# Patient Record
Sex: Male | Born: 1975 | Race: White | Hispanic: No | Marital: Married | Smoking: Former smoker
Health system: Southern US, Community
[De-identification: ages and names within clinical notes are randomized; demographics above are authoritative.]

## PROBLEM LIST (undated history)

## (undated) DIAGNOSIS — R7303 Prediabetes: Secondary | ICD-10-CM

## (undated) DIAGNOSIS — A749 Chlamydial infection, unspecified: Secondary | ICD-10-CM

## (undated) DIAGNOSIS — I10 Essential (primary) hypertension: Secondary | ICD-10-CM

## (undated) HISTORY — DX: Prediabetes: R73.03

## (undated) HISTORY — DX: Chlamydial infection, unspecified: A74.9

## (undated) HISTORY — DX: Essential (primary) hypertension: I10

## (undated) HISTORY — PX: HERNIA REPAIR: SHX51

---

## 1997-10-31 ENCOUNTER — Emergency Department (HOSPITAL_COMMUNITY): Admission: EM | Admit: 1997-10-31 | Discharge: 1997-10-31 | Payer: Self-pay | Admitting: Emergency Medicine

## 2005-07-01 ENCOUNTER — Emergency Department (HOSPITAL_COMMUNITY): Admission: EM | Admit: 2005-07-01 | Discharge: 2005-07-01 | Payer: Self-pay | Admitting: Emergency Medicine

## 2007-10-06 ENCOUNTER — Emergency Department (HOSPITAL_COMMUNITY): Admission: EM | Admit: 2007-10-06 | Discharge: 2007-10-06 | Payer: Self-pay | Admitting: Emergency Medicine

## 2010-06-28 NOTE — Consult Note (Signed)
NAME:  Ralph Small, Ralph Small            ACCOUNT NO.:  [id number]   MEDICAL RECORD NO.:  [id number]          PATIENT TYPE:  EMS   LOCATION:  [facility]                         FACILITY:  [facility]   PHYSICIAN:  Karol T. Lazarus Salines, M.D. DATE OF BIRTH:  [birth year]   DATE OF CONSULTATION:  10/06/2007  DATE OF DISCHARGE:                                 CONSULTATION   CHIEF COMPLAINT:  Severe sore throat.   HISTORY:  A 35 year old white male began having a significant sore  throat 6 days ago.  It lateralized to the right side including pain with  swallowing, pain with jaw motion, pain up into the ear, and some  tenderness in his neck.  He was seen in the emergency room earlier today  and found to have a positive strep culture, a positive monospot, and by  CT scan, also a right peritonsillar abscess.  He has not had previous  trouble with mono, strep throat, tonsillitis, nor has he had a prior  history of abscess.  He is not diabetic or otherwise immune compromised.   PAST MEDICAL HISTORY:  No known allergies.  No current medications.   SOCIAL HISTORY:  He is single.  He works for the organ transplant team.   FAMILY HISTORY:  Noncontributory.   REVIEW OF SYSTEMS:  Noncontributory.   PHYSICAL EXAMINATION:  This is a large framed and obese adult white  male, who appears distressed.  He has some fetor oris.  He does not have  a frank hot potato voice.  Mental status is sharp.  He hears well in  conversational speech.  Respirations unlabored through the nose.  The  head is atraumatic and neck is supple.  Cranial nerves intact.  I did  not examine the ear canals nor the anterior nose.  Oral cavity is fleshy  with teeth in good repair.  He has exudative tonsillitis on both sides  with bulging of the right hemi soft palate.  I did not examine  nasopharynx or hypopharynx.  Neck is without significant adenopathy.   IMPRESSION:  Right peritonsillar abscess.  Streptococcal tonsillitis.  Possible acute  mononucleosis.   PLAN:  I discussed this with the patient.  I will give him a dose of  intravenous penicillin G, intravenous Decadron, and I recommend that we  do an incision and drainage of the peritonsillar abscess here in the  emergency room today.  Details were discussed.  Questions were answered  and informed consent was obtained.   The patient received 4 mg of IV morphine and 2 mg of IV Versed for  conscious sedation technique.  While this was taking effect, the soft  palate was superficially anesthetized with Hurricaine spray x2.  Following this, 12 mL total of 1% Xylocaine with 1:100,000 epinephrine  was infiltrated superficially and then more deeply and into the right  peritonsillar plane with 1% Xylocaine with 1:100,000 epinephrine.  Several minutes were allowed for this to take effect.  At one of the  needle puncture sites, a small amount of pus was noted to drain back  out.   Under an adequate level, a 2-cm crescent incision  was made over the  superior pole of the right tonsil and carried down on the capsule of  tonsil approximately 1 cm.  No abscess cavity was encountered.  Deep to  this, the peritonsillar plane was opened with a tonsil hemostat.  A  purulent pocket was identified and drained approximately 8 mL of frank  pus with a fairly foul odor.  The patient tolerated this nicely.  Hemostasis was spontaneous.   He was allowed to gargle with saline and peroxide to clean up the mouth.  I will give him prescriptions for penicillin VK, Tylenol with Codeine  liquid.  I discussed the advancement of diet and activity.  I will see  him back in 1 week, sooner as needed.  He understands and agrees with  the discussion and plans.      Gloris [place]. Lazarus Salines, M.D.  Electronically Signed     KTW/MEDQ  D:  10/06/2007  T:  10/07/2007  Job:  [id number]

## 2017-11-21 ENCOUNTER — Encounter: Payer: Self-pay | Admitting: Cardiology

## 2017-11-21 NOTE — Telephone Encounter (Signed)
This encounter was created in error - please disregard.

## 2019-06-16 ENCOUNTER — Ambulatory Visit (INDEPENDENT_AMBULATORY_CARE_PROVIDER_SITE_OTHER): Payer: Managed Care, Other (non HMO) | Admitting: Family Medicine

## 2019-06-16 ENCOUNTER — Encounter: Payer: Self-pay | Admitting: Family Medicine

## 2019-06-16 ENCOUNTER — Other Ambulatory Visit: Payer: Self-pay

## 2019-06-16 VITALS — BP 138/76 | HR 79 | Temp 98.3°F | Ht 73.5 in | Wt >= 6400 oz

## 2019-06-16 DIAGNOSIS — R350 Frequency of micturition: Secondary | ICD-10-CM | POA: Diagnosis not present

## 2019-06-16 DIAGNOSIS — Z6841 Body Mass Index (BMI) 40.0 and over, adult: Secondary | ICD-10-CM

## 2019-06-16 DIAGNOSIS — Z13220 Encounter for screening for lipoid disorders: Secondary | ICD-10-CM

## 2019-06-16 DIAGNOSIS — G479 Sleep disorder, unspecified: Secondary | ICD-10-CM

## 2019-06-16 DIAGNOSIS — Z0000 Encounter for general adult medical examination without abnormal findings: Secondary | ICD-10-CM | POA: Insufficient documentation

## 2019-06-16 DIAGNOSIS — M79672 Pain in left foot: Secondary | ICD-10-CM

## 2019-06-16 DIAGNOSIS — R400 Somnolence: Secondary | ICD-10-CM

## 2019-06-16 DIAGNOSIS — M79671 Pain in right foot: Secondary | ICD-10-CM

## 2019-06-16 NOTE — Patient Instructions (Signed)
Great to meet you today! Please have labs completed, we'll be in touch with results.  Expect phone calls to arrange for sleep study and sports medicine appt. To discuss orthotics.     Exercising to Lose Weight Exercise is structured, repetitive physical activity to improve fitness and health. Getting regular exercise is important for everyone. It is especially important if you are overweight. Being overweight increases your risk of heart disease, stroke, diabetes, high blood pressure, and several types of cancer. Reducing your calorie intake and exercising can help you lose weight. Exercise is usually categorized as moderate or vigorous intensity. To lose weight, most people need to do a certain amount of moderate-intensity or vigorous-intensity exercise each week. Moderate-intensity exercise  Moderate-intensity exercise is any activity that gets you moving enough to burn at least three times more energy (calories) than if you were sitting. Examples of moderate exercise include:  Walking a mile in 15 minutes.  Doing light yard work.  Biking at an easy pace. Most people should get at least 150 minutes (2 hours and 30 minutes) a week of moderate-intensity exercise to maintain their body weight. Vigorous-intensity exercise Vigorous-intensity exercise is any activity that gets you moving enough to burn at least six times more calories than if you were sitting. When you exercise at this intensity, you should be working hard enough that you are not able to carry on a conversation. Examples of vigorous exercise include:  Running.  Playing a team sport, such as football, basketball, and soccer.  Jumping rope. Most people should get at least 75 minutes (1 hour and 15 minutes) a week of vigorous-intensity exercise to maintain their body weight. How can exercise affect me? When you exercise enough to burn more calories than you eat, you lose weight. Exercise also reduces body fat and builds  muscle. The more muscle you have, the more calories you burn. Exercise also:  Improves mood.  Reduces stress and tension.  Improves your overall fitness, flexibility, and endurance.  Increases bone strength. The amount of exercise you need to lose weight depends on:  Your age.  The type of exercise.  Any health conditions you have.  Your overall physical ability. Talk to your health care provider about how much exercise you need and what types of activities are safe for you. What actions can I take to lose weight? Nutrition   Make changes to your diet as told by your health care provider or diet and nutrition specialist (dietitian). This may include: ? Eating fewer calories. ? Eating more protein. ? Eating less unhealthy fats. ? Eating a diet that includes fresh fruits and vegetables, whole grains, low-fat dairy products, and lean protein. ? Avoiding foods with added fat, salt, and sugar.  Drink plenty of water while you exercise to prevent dehydration or heat stroke. Activity  Choose an activity that you enjoy and set realistic goals. Your health care provider can help you make an exercise plan that works for you.  Exercise at a moderate or vigorous intensity most days of the week. ? The intensity of exercise may vary from person to person. You can tell how intense a workout is for you by paying attention to your breathing and heartbeat. Most people will notice their breathing and heartbeat get faster with more intense exercise.  Do resistance training twice each week, such as: ? Push-ups. ? Sit-ups. ? Lifting weights. ? Using resistance bands.  Getting short amounts of exercise can be just as helpful as long structured periods  of exercise. If you have trouble finding time to exercise, try to include exercise in your daily routine. ? Get up, stretch, and walk around every 30 minutes throughout the day. ? Go for a walk during your lunch break. ? Park your car farther  away from your destination. ? If you take public transportation, get off one stop early and walk the rest of the way. ? Make phone calls while standing up and walking around. ? Take the stairs instead of elevators or escalators.  Wear comfortable clothes and shoes with good support.  Do not exercise so much that you hurt yourself, feel dizzy, or get very short of breath. Where to find more information  U.S. Department of Health and Human Services: ThisPath.fi  Centers for Disease Control and Prevention (CDC): FootballExhibition.com.br Contact a health care provider:  Before starting a new exercise program.  If you have questions or concerns about your weight.  If you have a medical problem that keeps you from exercising. Get help right away if you have any of the following while exercising:  Injury.  Dizziness.  Difficulty breathing or shortness of breath that does not go away when you stop exercising.  Chest pain.  Rapid heartbeat. Summary  Being overweight increases your risk of heart disease, stroke, diabetes, high blood pressure, and several types of cancer.  Losing weight happens when you burn more calories than you eat.  Reducing the amount of calories you eat in addition to getting regular moderate or vigorous exercise each week helps you lose weight. This information is not intended to replace advice given to you by your health care provider. Make sure you discuss any questions you have with your health care provider. Document Revised: 02/12/2017 Document Reviewed: 02/12/2017 Elsevier Patient Education  2020 [employer].

## 2019-06-16 NOTE — Progress Notes (Signed)
Ralph Small - 44 y.o. male MRN [id number]  Date of birth: 1975/05/16  Subjective Chief Complaint  Patient presents with  . Weight Loss  . Diabetes    HPI Ralph Small is a 44 y.o. male here today for initial visit and annual exam. He reports weight gain of about 80lbs over the past year.  Attributes this to decreased activity and increased snacking while at home due to pandemic.  He is concerned about developing health problems due to this. Reports increased fatigue, falls asleep easily in the evening after dinner and is tired throughout the day.  He also admits to increased urination, especially at night.  He does wake up gasping for air and heart racing some nights.  He has never had sleep study.   He has started working on dietary changes and walking more.  He does tire easily and have pain due to over supination.  He was going to have orthotics made however could not afford previously.   He is trying to increase stamina.   He is a non-smoker and rarely consumes EtOH.   Review of Systems  Constitutional: Negative for chills, fever, malaise/fatigue and weight loss.  HENT: Negative for congestion, ear pain and sore throat.   Eyes: Negative for blurred vision, double vision and pain.  Respiratory: Negative for cough and shortness of breath.   Cardiovascular: Negative for chest pain and palpitations.  Gastrointestinal: Negative for abdominal pain, blood in stool, constipation, heartburn and nausea.  Genitourinary: Positive for frequency. Negative for dysuria and urgency.  Musculoskeletal: Negative for joint pain and myalgias.  Neurological: Negative for dizziness and headaches.  Endo/Heme/Allergies: Does not bruise/bleed easily.  Psychiatric/Behavioral: Negative for depression. The patient is not nervous/anxious and does not have insomnia.     No Known Allergies  Past Medical History:  Diagnosis Date  . Chlamydia     Past Surgical History:  Procedure Laterality Date   . HERNIA REPAIR      Social History   Socioeconomic History  . Marital status: Married    Spouse name: Not on file  . Number of children: Not on file  . Years of education: Not on file  . Highest education level: Not on file  Occupational History  . Occupation: Coordinator    Comment: [employer]  Tobacco Use  . Smoking status: Former Smoker    Packs/day: 0.50    Years: 15.00    Pack years: 7.50    Types: Cigarettes    Quit date: 10/13/2009    Years since quitting: 9.6  . Smokeless tobacco: Never Used  Substance and Sexual Activity  . Alcohol use: Yes    Comment: Rarely  . Drug use: Never  . Sexual activity: Not Currently    Partners: Female    Birth control/protection: Abstinence  Other Topics Concern  . Not on file  Social History Narrative  . Not on file   Social Determinants of Health   Financial Resource Strain:   . Difficulty of Paying Living Expenses:   Food Insecurity:   . Worried About Programme researcher, broadcasting/film/video in the Last Year:   . Barista in the Last Year:   Transportation Needs:   . Freight forwarder (Medical):   Marland Kitchen Lack of Transportation (Non-Medical):   Physical Activity:   . Days of Exercise per Week:   . Minutes of Exercise per Session:   Stress:   . Feeling of Stress :   Social Connections:   .  Frequency of Communication with Friends and Family:   . Frequency of Social Gatherings with Friends and Family:   . Attends Religious Services:   . Active Member of Clubs or Organizations:   . Attends Archivist Meetings:   Marland Kitchen Marital Status:     No family history on file.  Health Maintenance  Topic Date Due  . HIV Screening  Never done  . TETANUS/TDAP  Never done  . INFLUENZA VACCINE  09/14/2019     ----------------------------------------------------------------------------------------------------------------------------------------------------------------------------------------------------------------- Physical Exam BP  138/76 (BP Location: Left Wrist, Patient Position: Sitting, Cuff Size: Large)   Pulse 79   Temp 98.3 F (36.8 C) (Oral)   Ht 6' 1.5" (1.867 m)   Wt (!) 514 lb 4.8 oz (233.3 kg)   SpO2 97%   BMI 66.93 kg/m   Physical Exam Constitutional:      General: He is not in acute distress. HENT:     Head: Normocephalic and atraumatic.     Right Ear: External ear normal.     Left Ear: External ear normal.     Mouth/Throat:     Mouth: Mucous membranes are moist.  Eyes:     General: No scleral icterus. Neck:     Thyroid: No thyromegaly.  Cardiovascular:     Rate and Rhythm: Normal rate and regular rhythm.     Heart sounds: Normal heart sounds.  Pulmonary:     Effort: Pulmonary effort is normal.     Breath sounds: Normal breath sounds.  Abdominal:     General: Bowel sounds are normal. There is no distension.     Palpations: Abdomen is soft.     Tenderness: There is no abdominal tenderness. There is no guarding.  Musculoskeletal:     Cervical back: Normal range of motion.  Lymphadenopathy:     Cervical: No cervical adenopathy.  Skin:    General: Skin is warm and dry.     Findings: No rash.  Neurological:     General: No focal deficit present.     Mental Status: He is alert and oriented to person, place, and time.     Cranial Nerves: No cranial nerve deficit.     Motor: No abnormal muscle tone.  Psychiatric:        Mood and Affect: Mood normal.        Behavior: Behavior normal.     ------------------------------------------------------------------------------------------------------------------------------------------------------------------------------------------------------------------- Assessment and Plan  Well adult exam Well adult Orders Placed This Encounter  Procedures  . COMPLETE METABOLIC PANEL WITH GFR  . CBC  . Lipid Profile  . TSH  . HgB A1c  . Ambulatory referral to Sleep Studies    Referral Priority:   Routine    Referral Type:   Consultation     Referral Reason:   Specialty Services Required    Number of Visits Requested:   1  . Ambulatory referral to Sports Medicine    Referral Priority:   Routine    Referral Type:   Consultation    Number of Visits Requested:   1  Screening: Lipid panel, A1c for increased urination.   Sleep study ordered as well.  Immunizations: UTD Anticipatory guidance/Risk factor reduction:  Discussed working on weight loss.  Increase walking to build stamina and tolerance.  Referral to sports med to discuss orthotics.     No orders of the defined types were placed in this encounter.   No follow-ups on file.    This visit occurred during the SARS-CoV-2 public health emergency.  Safety protocols were in place, including screening questions prior to the visit, additional usage of staff PPE, and extensive cleaning of exam room while observing appropriate contact time as indicated for disinfecting solutions.

## 2019-06-16 NOTE — Assessment & Plan Note (Signed)
Well adult Orders Placed This Encounter  Procedures  . COMPLETE METABOLIC PANEL WITH GFR  . CBC  . Lipid Profile  . TSH  . HgB A1c  . Ambulatory referral to Sleep Studies    Referral Priority:   Routine    Referral Type:   Consultation    Referral Reason:   Specialty Services Required    Number of Visits Requested:   1  . Ambulatory referral to Sports Medicine    Referral Priority:   Routine    Referral Type:   Consultation    Number of Visits Requested:   1  Screening: Lipid panel, A1c for increased urination.   Sleep study ordered as well.  Immunizations: UTD Anticipatory guidance/Risk factor reduction:  Discussed working on weight loss.  Increase walking to build stamina and tolerance.  Referral to sports med to discuss orthotics.

## 2019-06-17 LAB — CBC
HCT: 44 % (ref 38.5–50.0)
Hemoglobin: 14.7 g/dL (ref 13.2–17.1)
MCH: 30.4 pg (ref 27.0–33.0)
MCHC: 33.4 g/dL (ref 32.0–36.0)
MCV: 91.1 fL (ref 80.0–100.0)
MPV: 10.7 fL (ref 7.5–12.5)
Platelets: 241 10*3/uL (ref 140–400)
RBC: 4.83 10*6/uL (ref 4.20–5.80)
RDW: 11.7 % (ref 11.0–15.0)
WBC: 8.3 10*3/uL (ref 3.8–10.8)

## 2019-06-17 LAB — LIPID PANEL
Cholesterol: 179 mg/dL (ref <200)
HDL: 38 mg/dL — ABNORMAL LOW (ref >=40)
LDL Cholesterol (Calc): 123 mg/dL (calc) — ABNORMAL HIGH
Non-HDL Cholesterol (Calc): 141 mg/dL (calc) — ABNORMAL HIGH (ref <130)
Total CHOL/HDL Ratio: 4.7 (calc) (ref <5.0)
Triglycerides: 82 mg/dL (ref <150)

## 2019-06-17 LAB — COMPLETE METABOLIC PANEL WITH GFR
AG Ratio: 1.3 (calc) (ref 1.0–2.5)
ALT: 42 U/L (ref 9–46)
AST: 23 U/L (ref 10–40)
Albumin: 4.2 g/dL (ref 3.6–5.1)
Alkaline phosphatase (APISO): 59 U/L (ref 36–130)
BUN: 20 mg/dL (ref 7–25)
CO2: 30 mmol/L (ref 20–32)
Calcium: 9.7 mg/dL (ref 8.6–10.3)
Chloride: 103 mmol/L (ref 98–110)
Creat: 0.92 mg/dL (ref 0.60–1.35)
GFR, Est African American: 117 mL/min/{1.73_m2} (ref >=60)
GFR, Est Non African American: 101 mL/min/{1.73_m2} (ref >=60)
Globulin: 3.2 g/dL (calc) (ref 1.9–3.7)
Glucose, Bld: 115 mg/dL — ABNORMAL HIGH (ref 65–99)
Potassium: 4.6 mmol/L (ref 3.5–5.3)
Sodium: 138 mmol/L (ref 135–146)
Total Bilirubin: 0.4 mg/dL (ref 0.2–1.2)
Total Protein: 7.4 g/dL (ref 6.1–8.1)

## 2019-06-17 LAB — HEMOGLOBIN A1C
Hgb A1c MFr Bld: 5.7 % of total Hgb — ABNORMAL HIGH (ref <5.7)
Mean Plasma Glucose: 117 (calc)
eAG (mmol/L): 6.5 (calc)

## 2019-06-17 LAB — TSH: TSH: 2.37 mIU/L (ref 0.40–4.50)

## 2019-06-24 ENCOUNTER — Other Ambulatory Visit: Payer: Self-pay

## 2019-06-24 ENCOUNTER — Ambulatory Visit (HOSPITAL_BASED_OUTPATIENT_CLINIC_OR_DEPARTMENT_OTHER)
Admission: RE | Admit: 2019-06-24 | Discharge: 2019-06-24 | Disposition: A | Discharge status: Home / Self Care | Payer: Managed Care, Other (non HMO) | Source: Ambulatory Visit | Attending: Family Medicine | Admitting: Family Medicine

## 2019-06-24 ENCOUNTER — Ambulatory Visit (INDEPENDENT_AMBULATORY_CARE_PROVIDER_SITE_OTHER): Payer: Managed Care, Other (non HMO) | Admitting: Family Medicine

## 2019-06-24 ENCOUNTER — Encounter: Payer: Self-pay | Admitting: Family Medicine

## 2019-06-24 DIAGNOSIS — M25872 Other specified joint disorders, left ankle and foot: Secondary | ICD-10-CM | POA: Insufficient documentation

## 2019-06-24 NOTE — Progress Notes (Signed)
Ralph Small - 44 y.o. male MRN [id number]  Date of birth: 09/11/1975  SUBJECTIVE:  Including CC & ROS.  Chief Complaint  Patient presents with  . Foot Orthotics    ANASTASIOS Small is a 44 y.o. male that is presenting with acute on chronic left ankle pain.  The pain is been ongoing for some time.  It usually occurs on the lateral aspect of the ankle joint.  He has a history of excessive supination and has tried different methods to improve this area.  Seems to be worse with no prolonged walking that he does.  Has a distant history of inversion injuries.   Review of Systems See HPI   HISTORY: Past Medical, Surgical, Social, and Family History Reviewed & Updated per EMR.   Pertinent Historical Findings include:  Past Medical History:  Diagnosis Date  . Chlamydia     Past Surgical History:  Procedure Laterality Date  . HERNIA REPAIR      Family History  Problem Relation Age of Onset  . Diabetes Mother   . Heart attack Father   . Breast cancer Maternal Aunt     Social History   Socioeconomic History  . Marital status: Married    Spouse name: Not on file  . Number of children: Not on file  . Years of education: Not on file  . Highest education level: Not on file  Occupational History  . Occupation: Coordinator    Comment: [employer]  Tobacco Use  . Smoking status: Former Smoker    Packs/day: 0.50    Years: 15.00    Pack years: 7.50    Types: Cigarettes    Quit date: 10/13/2009    Years since quitting: 9.7  . Smokeless tobacco: Never Used  Substance and Sexual Activity  . Alcohol use: Yes    Comment: Rarely  . Drug use: Never  . Sexual activity: Not Currently    Partners: Female    Birth control/protection: Abstinence  Other Topics Concern  . Not on file  Social History Narrative  . Not on file   Social Determinants of Health   Financial Resource Strain:   . Difficulty of Paying Living Expenses:   Food Insecurity:   . Worried About Patent examiner in the Last Year:   . Barista in the Last Year:   Transportation Needs:   . Freight forwarder (Medical):   Marland Kitchen Lack of Transportation (Non-Medical):   Physical Activity:   . Days of Exercise per Week:   . Minutes of Exercise per Session:   Stress:   . Feeling of Stress :   Social Connections:   . Frequency of Communication with Friends and Family:   . Frequency of Social Gatherings with Friends and Family:   . Attends Religious Services:   . Active Member of Clubs or Organizations:   . Attends Banker Meetings:   Marland Kitchen Marital Status:   Intimate Partner Violence:   . Fear of Current or Ex-Partner:   . Emotionally Abused:   Marland Kitchen Physically Abused:   . Sexually Abused:      PHYSICAL EXAM:  VS: BP (!) 146/76   Pulse 88   Ht 6\' 2"  (1.88 m)   Wt (!) 514 lb (233.1 kg)   BMI 65.99 kg/m  Physical Exam Gen: NAD, alert, cooperative with exam, well-appearing Right and left foot: Excessive supination with the left. Tenderness to palpation over the peroneal tendons. Right remains fairly  neutral. Normal strength resistance. Neurovascularly intact  Patient was fitted for a standard, cushioned, semi-rigid orthotic. The orthotic was heated and afterward the patient stood on the orthotic blank positioned on the orthotic stand. The patient was positioned in subtalar neutral position and 10 degrees of ankle dorsiflexion in a weight bearing stance. After completion of molding, a stable base was applied to the orthotic blank. The blank was ground to a stable position for weight bearing. Size: 14 Pairs: 2 Base: Blue EVA Additional Posting and Padding: Fifth ray post on the left.  Also took a three-quarter heel lift and cut diagonally to create a lateral wedge extending the hindfoot.  Placed a lateral wedge on the right. The patient ambulated these, and they were very comfortable.    ASSESSMENT & PLAN:   Ankle impingement syndrome, left Has excessive  supination of the left with it being worse than the right.  Trying to increase no wounds on the lateral aspect of her knee more than neutral.  Placed a smaller wedge on the right. -Orthotics. -X-ray. -Counseled on home exercise therapy and supportive care. -Could consider physical therapy.  Morbid obesity ([place]) Discussed different possibilities today.  Could consider referral to weight management.

## 2019-06-24 NOTE — Patient Instructions (Signed)
Nice to meet you Please try ice as needed  Please try the exercises  We may need to put the extra padding on the insoles every 3-6 months I will call with the results from today  Please send me a message in MyChart with any questions or updates.  Please see me back in 4-6 weeks.   --Dr. Jordan Likes

## 2019-06-24 NOTE — Assessment & Plan Note (Signed)
Discussed different possibilities today.  Could consider referral to weight management.

## 2019-06-24 NOTE — Assessment & Plan Note (Signed)
Has excessive supination of the left with it being worse than the right.  Trying to increase no wounds on the lateral aspect of her knee more than neutral.  Placed a smaller wedge on the right. -Orthotics. -X-ray. -Counseled on home exercise therapy and supportive care. -Could consider physical therapy.

## 2019-06-25 ENCOUNTER — Telehealth: Payer: Self-pay | Admitting: Family Medicine

## 2019-06-25 NOTE — Telephone Encounter (Signed)
Left VM for patient. If he calls back please have him speak with a nurse/CMA and inform that his ankle joint looks good. He has chronic changes in the lateral hindfoot. We may need to consider a foot xay in the future.   If any questions then please take the best time and phone number to call and I will try to call him back.   Myra Rude, MD [facility] 06/25/2019, 9:12 AM

## 2019-08-06 ENCOUNTER — Other Ambulatory Visit: Payer: Self-pay

## 2019-08-06 ENCOUNTER — Ambulatory Visit: Payer: Managed Care, Other (non HMO) | Admitting: Family Medicine

## 2019-08-06 ENCOUNTER — Encounter: Payer: Self-pay | Admitting: Family Medicine

## 2019-08-06 DIAGNOSIS — M25872 Other specified joint disorders, left ankle and foot: Secondary | ICD-10-CM

## 2019-08-06 NOTE — Assessment & Plan Note (Signed)
Has been doing well with insoles and is getting stability and strength. -Counseled on home exercise therapy and supportive care. -Could consider adjusting the lateral wedge if needed.  Could consider injection of physical therapy.

## 2019-08-06 NOTE — Progress Notes (Signed)
Ralph Small - 44 y.o. male MRN [id number]  Date of birth: Sep 01, 1975  SUBJECTIVE:  Including CC & ROS.  Chief Complaint  Patient presents with  . Follow-up    left ankle    Ralph Small is a 44 y.o. male that is following up for his ankle pain.  He has been active in his exercise classes loss of weight.  He reports improvement of his range of motion as well as stability.  He feels like the insoles have been helping.   Review of Systems See HPI   HISTORY: Past Medical, Surgical, Social, and Family History Reviewed & Updated per EMR.   Pertinent Historical Findings include:  Past Medical History:  Diagnosis Date  . Chlamydia     Past Surgical History:  Procedure Laterality Date  . HERNIA REPAIR      Family History  Problem Relation Age of Onset  . Diabetes Mother   . Heart attack Father   . Breast cancer Maternal Aunt     Social History   Socioeconomic History  . Marital status: Married    Spouse name: Not on file  . Number of children: Not on file  . Years of education: Not on file  . Highest education level: Not on file  Occupational History  . Occupation: Coordinator    Comment: [employer]  Tobacco Use  . Smoking status: Former Smoker    Packs/day: 0.50    Years: 15.00    Pack years: 7.50    Types: Cigarettes    Quit date: 10/13/2009    Years since quitting: 9.8  . Smokeless tobacco: Never Used  Vaping Use  . Vaping Use: Every day  Substance and Sexual Activity  . Alcohol use: Yes    Comment: Rarely  . Drug use: Never  . Sexual activity: Not Currently    Partners: Female    Birth control/protection: Abstinence  Other Topics Concern  . Not on file  Social History Narrative  . Not on file   Social Determinants of Health   Financial Resource Strain:   . Difficulty of Paying Living Expenses:   Food Insecurity:   . Worried About Programme researcher, broadcasting/film/video in the Last Year:   . Barista in the Last Year:   Transportation Needs:   .  Freight forwarder (Medical):   Marland Kitchen Lack of Transportation (Non-Medical):   Physical Activity:   . Days of Exercise per Week:   . Minutes of Exercise per Session:   Stress:   . Feeling of Stress :   Social Connections:   . Frequency of Communication with Friends and Family:   . Frequency of Social Gatherings with Friends and Family:   . Attends Religious Services:   . Active Member of Clubs or Organizations:   . Attends Banker Meetings:   Marland Kitchen Marital Status:   Intimate Partner Violence:   . Fear of Current or Ex-Partner:   . Emotionally Abused:   Marland Kitchen Physically Abused:   . Sexually Abused:      PHYSICAL EXAM:  VS: BP (!) 145/82   Pulse 98   Ht 6\' 2"  (1.88 m)   BMI 65.99 kg/m  Physical Exam Gen: NAD, alert, cooperative with exam, well-appearing MSK:  Left ankle: Able to raise up on tiptoes and low back on heels. Normal strength resistance. Neurovascularly intact     ASSESSMENT & PLAN:   Ankle impingement syndrome, left Has been doing well with insoles and  is getting stability and strength. -Counseled on home exercise therapy and supportive care. -Could consider adjusting the lateral wedge if needed.  Could consider injection of physical therapy.

## 2020-03-18 ENCOUNTER — Emergency Department
Admission: RE | Admit: 2020-03-18 | Discharge: 2020-03-18 | Disposition: A | Discharge status: Home / Self Care | Payer: Managed Care, Other (non HMO) | Source: Ambulatory Visit | Attending: Family Medicine | Admitting: Family Medicine

## 2020-03-18 ENCOUNTER — Other Ambulatory Visit: Payer: Self-pay

## 2020-03-18 VITALS — BP 162/85 | HR 82 | Temp 98.2°F | Resp 16 | Ht 74.0 in | Wt >= 6400 oz

## 2020-03-18 DIAGNOSIS — H6692 Otitis media, unspecified, left ear: Secondary | ICD-10-CM | POA: Diagnosis not present

## 2020-03-18 DIAGNOSIS — H9313 Tinnitus, bilateral: Secondary | ICD-10-CM | POA: Diagnosis not present

## 2020-03-18 HISTORY — DX: Morbid (severe) obesity due to excess calories: E66.01

## 2020-03-18 MED ORDER — AMOXICILLIN 875 MG PO TABS: ORAL_TABLET | ORAL | 0 refills | Status: DC

## 2020-03-18 MED ORDER — PREDNISONE 20 MG PO TABS: ORAL_TABLET | ORAL | 0 refills | Status: DC

## 2020-03-18 NOTE — ED Triage Notes (Signed)
Patient here day 3 of having vertigo and tinnitus bilaterally; pressure behind eyes; has had postural vertigo in past. Patient has not had covid nor influenza vaccinations.

## 2020-03-18 NOTE — ED Provider Notes (Signed)
[facility]    CSN: [id number] Arrival date & time: 03/18/20  1456      History   Chief Complaint Chief Complaint  Patient presents with  . Ear Problem    HPI Ralph Small is a 45 y.o. male.   Patient complains of onset three days ago of intermittent tinnitus (mostly when supine) and intermittent vertigo.  He denies recent URI, fever, hearing loss, earache, other neurologic symptoms, and headache.  He states that he has had similar vertigo in the past that resolved spontaneously.  The history is provided by the patient.    Past Medical History:  Diagnosis Date  . Chlamydia   . Morbid obesity [facility])     Patient Active Problem List   Diagnosis Date Noted  . Morbid obesity (HCC) 06/24/2019  . Ankle impingement syndrome, left 06/24/2019  . Well adult exam 06/16/2019    Past Surgical History:  Procedure Laterality Date  . HERNIA REPAIR         Home Medications    Prior to Admission medications   Medication Sig Start Date End Date Taking? Authorizing Provider  amoxicillin (AMOXIL) 875 MG tablet Take one tab PO Q12hr 03/18/20  Yes Chaia Ikard, Tera Mater, MD  predniSONE (DELTASONE) 20 MG tablet Take one tab by mouth twice daily for 4 days, then one daily. Take with food. 03/18/20  Yes Lattie Haw, MD    Family History Family History  Problem Relation Age of Onset  . Diabetes Mother   . Heart attack Father   . Breast cancer Maternal Aunt     Social History Social History   Tobacco Use  . Smoking status: Former Smoker    Packs/day: 0.50    Years: 15.00    Pack years: 7.50    Types: Cigarettes    Quit date: 10/13/2009    Years since quitting: 10.4  . Smokeless tobacco: Never Used  Vaping Use  . Vaping Use: Every day  Substance Use Topics  . Alcohol use: Yes    Comment: Rarely  . Drug use: Never     Allergies   Patient has no known allergies.   Review of Systems Review of Systems No sore throat No cough No pleuritic pain No  wheezing + nasal congestion No post-nasal drainage No sinus pain/pressure No itchy/red eyes No earache No hemoptysis + tinnitus + dizziness, intermittent No hearing loss No SOB No fever/chills No nausea No vomiting No abdominal pain No diarrhea No urinary symptoms No skin rash No fatigue No myalgias No headache   Physical Exam Triage Vital Signs ED Triage Vitals  Enc Vitals Group     BP 03/18/20 1522 (!) 162/85     Pulse Rate 03/18/20 1522 82     Resp 03/18/20 1522 16     Temp 03/18/20 1522 98.2 F (36.8 C)     Temp Source 03/18/20 1522 Oral     SpO2 03/18/20 1522 96 %     Weight 03/18/20 1527 (!) 500 lb (226.8 kg)     Height 03/18/20 1527 6\' 2"  (1.88 m)     Head Circumference --      Peak Flow --      Pain Score 03/18/20 1526 0     Pain Loc --      Pain Edu? --      Excl. in GC? --    No data found.  Updated Vital Signs BP (!) 162/85 (BP Location: Right Wrist)   Pulse 82  Temp 98.2 F (36.8 C) (Oral)   Resp 16   Ht 6\' 2"  (1.88 m)   Wt (!) 226.8 kg   SpO2 96%   BMI 64.20 kg/m   Visual Acuity Right Eye Distance:   Left Eye Distance:   Bilateral Distance:    Right Eye Near:   Left Eye Near:    Bilateral Near:     Physical Exam Nursing notes and Vital Signs reviewed. Appearance:  Patient appears obese, stated age, and in no acute distress Eyes:  Pupils are equal, round, and reactive to light and accomodation.  Extraocular movement is intact.  Conjunctivae are not inflamed.  No nystagmus.  Fundi benign.  Ears:   Right canal partly occluded with cerumen, but tympanic membrane appears normal.  Left canal is almost completely occluded with cerumen, but a small opening is present above the cerumen.  However, unable to visualize the left tympanic membrane. Nose:  Mildly congested turbinates.  No sinus tenderness.  Pharynx:  Normal Neck:  Supple.  No adenopathy.  Lungs:  Clear to auscultation.  Breath sounds are equal.  Moving air well. Heart:  Regular  rate and rhythm without murmurs, rubs, or gallops.  Abdomen:  Nontender without masses or hepatosplenomegaly.  Bowel sounds are present.  No CVA or flank tenderness.  Extremities:  No edema.  Skin:  No rash present.  Neurologic:  Cranial nerves 2 through 12 are normal.  Patellar and achilles reflexes are normal.  Cerebellar function is intact (finger-to-nose and rapid alternating hand movement).  Gait and station are normal.   UC Treatments / Results  Labs (all labs ordered are listed, but only abnormal results are displayed) Tympanometry:  Right ear tympanogram normal; Left ear tympanogram low peak height, normal ear volume.  EKG   Radiology No results found.  Procedures Procedures (including critical care time)  Medications Ordered in UC Medications - No data to display  Initial Impression / Assessment and Plan / UC Course  I have reviewed the triage vital signs and the nursing notes.  Pertinent labs & imaging results that were available during my care of the patient were reviewed by me and considered in my medical decision making (see chart for details).    Although unable to visualize the left tympanic membrane, left tympanogram is distinctly abnormal. Begin amoxicillin and prednisone burst/taper. Followup with ENT if not resolved 7 to 10 days.   Final Clinical Impressions(s) / UC Diagnoses   Final diagnoses:  Acute left otitis media  Tinnitus of both ears   Discharge Instructions   None    ED Prescriptions    Medication Sig Dispense Auth. Provider   amoxicillin (AMOXIL) 875 MG tablet Take one tab PO Q12hr 14 tablet , MD   predniSONE (DELTASONE) 20 MG tablet Take one tab by mouth twice daily for 4 days, then one daily. Take with food. 12 tablet Lattie Haw, MD        Lattie Haw, MD 03/20/20 [phone]

## 2020-04-01 ENCOUNTER — Other Ambulatory Visit: Payer: Self-pay

## 2020-04-01 ENCOUNTER — Encounter: Payer: Self-pay | Admitting: Family Medicine

## 2020-04-01 ENCOUNTER — Ambulatory Visit (INDEPENDENT_AMBULATORY_CARE_PROVIDER_SITE_OTHER): Payer: Managed Care, Other (non HMO) | Admitting: Family Medicine

## 2020-04-01 DIAGNOSIS — G4730 Sleep apnea, unspecified: Secondary | ICD-10-CM

## 2020-04-01 DIAGNOSIS — Z0000 Encounter for general adult medical examination without abnormal findings: Secondary | ICD-10-CM

## 2020-04-01 DIAGNOSIS — Z13220 Encounter for screening for lipoid disorders: Secondary | ICD-10-CM

## 2020-04-01 DIAGNOSIS — I10 Essential (primary) hypertension: Secondary | ICD-10-CM

## 2020-04-01 LAB — CBC
MCHC: 33.7 g/dL (ref 32.0–36.0)
RBC: 4.74 10*6/uL (ref 4.20–5.80)

## 2020-04-01 MED ORDER — LOSARTAN POTASSIUM 50 MG PO TABS: 50.0000 mg | ORAL_TABLET | Freq: Every day | ORAL | 3 refills | Status: DC

## 2020-04-01 NOTE — Assessment & Plan Note (Signed)
Well adult Orders Placed This Encounter  Procedures  . COMPLETE METABOLIC PANEL WITH GFR  . CBC  . Lipid Profile  . TSH  . HgB A1c  . Ambulatory referral to Sleep Studies    Referral Priority:   Routine    Referral Type:   Consultation    Referral Reason:   Specialty Services Required    Number of Visits Requested:   1  . Amb Ref to Medical Weight Management    Referral Priority:   Routine    Referral Type:   Consultation    Number of Visits Requested:   1  Screening: would like to hold off on colonc cancer screening for now. Immunizations: Declines flu/covid for now.  Educated on risks of disease.  Anticipatory guidance/Risk factor reduction:  Recommendations per AVS.  Referral placed to medical weight management and sleep study.

## 2020-04-01 NOTE — Progress Notes (Signed)
Ralph Small - 45 y.o. male MRN [id number]  Date of birth: 1975-02-17  Subjective Chief Complaint  Patient presents with  . Weight Loss  . Annual Exam    HPI Ralph Small is a 45 y.o. male here today for annual exam.    Reports loss of his mother last year which has contributed to increased stress related to managing the estate.  He has found that he stress eats and weight is up about 20lbs over the past year.  He plans on seeing a therapist through work to help with anxiety and stress.    He is not sleeping well.  Wife tells him that he snore heavily and stops breathing while sleeping.  He would like a referral for sleep study and for medical weight management.    He does try to stay active but does have some problems with deconditioning when exercising.   He is a non-smoker and rarely consumes EtOH  He would like to have updated labs today.   Review of Systems  Constitutional: Negative for chills, fever, malaise/fatigue and weight loss.  HENT: Negative for congestion, ear pain and sore throat.   Eyes: Negative for blurred vision, double vision and pain.  Respiratory: Negative for cough and shortness of breath.   Cardiovascular: Negative for chest pain and palpitations.  Gastrointestinal: Negative for abdominal pain, blood in stool, constipation, heartburn and nausea.  Genitourinary: Negative for dysuria and urgency.  Musculoskeletal: Negative for joint pain and myalgias.  Neurological: Negative for dizziness and headaches.  Endo/Heme/Allergies: Does not bruise/bleed easily.  Psychiatric/Behavioral: Negative for depression. The patient is not nervous/anxious and does not have insomnia.     No Known Allergies  Past Medical History:  Diagnosis Date  . Chlamydia   . Morbid obesity (HCC)     Past Surgical History:  Procedure Laterality Date  . HERNIA REPAIR      Social History   Socioeconomic History  . Marital status: Married    Spouse name: Not on file   . Number of children: Not on file  . Years of education: Not on file  . Highest education level: Not on file  Occupational History  . Occupation: Coordinator    Comment: [employer]  Tobacco Use  . Smoking status: Former Smoker    Packs/day: 0.50    Years: 15.00    Pack years: 7.50    Types: Cigarettes    Quit date: 10/13/2009    Years since quitting: 10.4  . Smokeless tobacco: Never Used  Vaping Use  . Vaping Use: Every day  Substance and Sexual Activity  . Alcohol use: Yes    Comment: Rarely  . Drug use: Never  . Sexual activity: Not Currently    Partners: Female    Birth control/protection: Abstinence  Other Topics Concern  . Not on file  Social History Narrative  . Not on file   Social Determinants of Health   Financial Resource Strain: Not on file  Food Insecurity: Not on file  Transportation Needs: Not on file  Physical Activity: Not on file  Stress: Not on file  Social Connections: Not on file    Family History  Problem Relation Age of Onset  . Diabetes Mother   . Heart attack Father   . Breast cancer Maternal Aunt     Health Maintenance  Topic Date Due  . Hepatitis C Screening  Never done  . HIV Screening  Never done  . TETANUS/TDAP  Never done  .  COLONOSCOPY (Pts 45-71yrs Insurance coverage will need to be confirmed)  Never done  . COVID-19 Vaccine (1) 04/17/2020 (Originally 07/18/1979)  . INFLUENZA VACCINE  05/13/2020 (Originally 09/14/2019)     ----------------------------------------------------------------------------------------------------------------------------------------------------------------------------------------------------------------- Physical Exam BP (!) 152/73 (BP Location: Left Wrist, Patient Position: Sitting, Cuff Size: Large)   Pulse 88   Temp 98.2 F (36.8 C) (Oral)   Wt (!) 536 lb (243.1 kg)   SpO2 96%   BMI 68.82 kg/m   Physical Exam Constitutional:      General: He is not in acute distress.    Appearance: He is  well-nourished.  HENT:     Head: Normocephalic and atraumatic.     Right Ear: External ear normal.     Left Ear: External ear normal.     Mouth/Throat:     Mouth: Oropharynx is clear and moist.  Eyes:     General: No scleral icterus. Neck:     Thyroid: No thyromegaly.  Cardiovascular:     Rate and Rhythm: Normal rate and regular rhythm.     Pulses: Intact distal pulses.     Heart sounds: Normal heart sounds.  Pulmonary:     Effort: Pulmonary effort is normal.     Breath sounds: Normal breath sounds.  Abdominal:     General: Bowel sounds are normal. There is no distension.     Palpations: Abdomen is soft.     Tenderness: There is no abdominal tenderness. There is no guarding.  Musculoskeletal:        General: No edema.     Cervical back: Normal range of motion.  Lymphadenopathy:     Cervical: No cervical adenopathy.  Skin:    General: Skin is warm and dry.     Findings: No rash.  Neurological:     Mental Status: He is alert and oriented to person, place, and time.     Cranial Nerves: No cranial nerve deficit.     Motor: No abnormal muscle tone.  Psychiatric:        Mood and Affect: Mood and affect normal.        Behavior: Behavior normal.     ------------------------------------------------------------------------------------------------------------------------------------------------------------------------------------------------------------------- Assessment and Plan  Well adult exam Well adult Orders Placed This Encounter  Procedures  . COMPLETE METABOLIC PANEL WITH GFR  . CBC  . Lipid Profile  . TSH  . HgB A1c  . Ambulatory referral to Sleep Studies    Referral Priority:   Routine    Referral Type:   Consultation    Referral Reason:   Specialty Services Required    Number of Visits Requested:   1  . Amb Ref to Medical Weight Management    Referral Priority:   Routine    Referral Type:   Consultation    Number of Visits Requested:   1  Screening: would  like to hold off on colonc cancer screening for now. Immunizations: Declines flu/covid for now.  Educated on risks of disease.  Anticipatory guidance/Risk factor reduction:  Recommendations per AVS.  Referral placed to medical weight management and sleep study.   Essential hypertension Start losartan 50mg  daily.  Instructed to follow low sodium diet and given information on DASH diet.    Meds ordered this encounter  Medications  . losartan (COZAAR) 50 MG tablet    Sig: Take 1 tablet (50 mg total) by mouth daily.    Dispense:  90 tablet    Refill:  3    Return in about 5 weeks (around 05/06/2020)  for Nurse visit for BP check. .    This visit occurred during the SARS-CoV-2 public health emergency.  Safety protocols were in place, including screening questions prior to the visit, additional usage of staff PPE, and extensive cleaning of exam room while observing appropriate contact time as indicated for disinfecting solutions.

## 2020-04-01 NOTE — Patient Instructions (Signed)
Start losartan 50mg  daily for blood pressure.  Return in 4-6 weeks for BP check.  Have labs completed.    .pdf">  DASH Eating Plan DASH stands for Dietary Approaches to Stop Hypertension. The DASH eating plan is a healthy eating plan that has been shown to:  Reduce high blood pressure (hypertension).  Reduce your risk for type 2 diabetes, heart disease, and stroke.  Help with weight loss. What are tips for following this plan? Reading food labels  Check food labels for the amount of salt (sodium) per serving. Choose foods with less than 5 percent of the Daily Value of sodium. Generally, foods with less than 300 milligrams (mg) of sodium per serving fit into this eating plan.  To find whole grains, look for the word "whole" as the first word in the ingredient list. Shopping  Buy products labeled as "low-sodium" or "no salt added."  Buy fresh foods. Avoid canned foods and pre-made or frozen meals. Cooking  Avoid adding salt when cooking. Use salt-free seasonings or herbs instead of table salt or sea salt. Check with your health care provider or pharmacist before using salt substitutes.  Do not fry foods. Cook foods using healthy methods such as baking, boiling, grilling, roasting, and broiling instead.  Cook with heart-healthy oils, such as olive, canola, avocado, soybean, or sunflower oil. Meal planning  Eat a balanced diet that includes: ? 4 or more servings of fruits and 4 or more servings of vegetables each day. Try to fill one-half of your plate with fruits and vegetables. ? 6-8 servings of whole grains each day. ? Less than 6 oz (170 g) of lean meat, poultry, or fish each day. A 3-oz (85-g) serving of meat is about the same size as a deck of cards. One egg equals 1 oz (28 g). ? 2-3 servings of low-fat dairy each day. One serving is 1 cup (237 mL). ? 1 serving of nuts, seeds, or beans 5 times each week. ? 2-3  servings of heart-healthy fats. Healthy fats called omega-3 fatty acids are found in foods such as walnuts, flaxseeds, fortified milks, and eggs. These fats are also found in cold-water fish, such as sardines, salmon, and mackerel.  Limit how much you eat of: ? Canned or prepackaged foods. ? Food that is high in trans fat, such as some fried foods. ? Food that is high in saturated fat, such as fatty meat. ? Desserts and other sweets, sugary drinks, and other foods with added sugar. ? Full-fat dairy products.  Do not salt foods before eating.  Do not eat more than 4 egg yolks a week.  Try to eat at least 2 vegetarian meals a week.  Eat more home-cooked food and less restaurant, buffet, and fast food.   Lifestyle  When eating at a restaurant, ask that your food be prepared with less salt or no salt, if possible.  If you drink alcohol: ? Limit how much you use to:  0-1 drink a day for women who are not pregnant.  0-2 drinks a day for men. ? Be aware of how much alcohol is in your drink. In the [place] one drink equals one 12 oz bottle of beer (355 mL), one 5 oz glass of wine (148 mL), or one 1 oz glass of hard liquor (44 mL). General information  Avoid eating more than 2,300 mg of salt a day. If you have hypertension, you may need to reduce your sodium intake to 1,500 mg a day.  Work  with your health care provider to maintain a healthy body weight or to lose weight. Ask what an ideal weight is for you.  Get at least 30 minutes of exercise that causes your heart to beat faster (aerobic exercise) most days of the week. Activities may include walking, swimming, or biking.  Work with your health care provider or dietitian to adjust your eating plan to your individual calorie needs. What foods should I eat? Fruits All fresh, dried, or frozen fruit. Canned fruit in natural juice (without added sugar). Vegetables Fresh or frozen vegetables (raw, steamed, roasted, or grilled). Low-sodium  or reduced-sodium tomato and vegetable juice. Low-sodium or reduced-sodium tomato sauce and tomato paste. Low-sodium or reduced-sodium canned vegetables. Grains Whole-grain or whole-wheat bread. Whole-grain or whole-wheat pasta. Brown rice. Orpah Cobb. Bulgur. Whole-grain and low-sodium cereals. Pita bread. Low-fat, low-sodium crackers. Whole-wheat flour tortillas. Meats and other proteins Skinless chicken or [place]. Ground chicken or [place]. Pork with fat trimmed off. Fish and seafood. Egg whites. Dried beans, peas, or lentils. Unsalted nuts, nut butters, and seeds. Unsalted canned beans. Lean cuts of beef with fat trimmed off. Low-sodium, lean precooked or cured meat, such as sausages or meat loaves. Dairy Low-fat (1%) or fat-free (skim) milk. Reduced-fat, low-fat, or fat-free cheeses. Nonfat, low-sodium ricotta or cottage cheese. Low-fat or nonfat yogurt. Low-fat, low-sodium cheese. Fats and oils Soft margarine without trans fats. Vegetable oil. Reduced-fat, low-fat, or light mayonnaise and salad dressings (reduced-sodium). Canola, safflower, olive, avocado, soybean, and sunflower oils. Avocado. Seasonings and condiments Herbs. Spices. Seasoning mixes without salt. Other foods Unsalted popcorn and pretzels. Fat-free sweets. The items listed above may not be a complete list of foods and beverages you can eat. Contact a dietitian for more information. What foods should I avoid? Fruits Canned fruit in a light or heavy syrup. Fried fruit. Fruit in cream or butter sauce. Vegetables Creamed or fried vegetables. Vegetables in a cheese sauce. Regular canned vegetables (not low-sodium or reduced-sodium). Regular canned tomato sauce and paste (not low-sodium or reduced-sodium). Regular tomato and vegetable juice (not low-sodium or reduced-sodium). Rosita Fire. Olives. Grains Baked goods made with fat, such as croissants, muffins, or some breads. Dry pasta or rice meal packs. Meats and other  proteins Fatty cuts of meat. Ribs. Fried meat. Tomasa Blase. Bologna, salami, and other precooked or cured meats, such as sausages or meat loaves. Fat from the back of a pig (fatback). Bratwurst. Salted nuts and seeds. Canned beans with added salt. Canned or smoked fish. Whole eggs or egg yolks. Chicken or [place] with skin. Dairy Whole or 2% milk, cream, and half-and-half. Whole or full-fat cream cheese. Whole-fat or sweetened yogurt. Full-fat cheese. Nondairy creamers. Whipped toppings. Processed cheese and cheese spreads. Fats and oils Butter. Stick margarine. Lard. Shortening. Ghee. Bacon fat. Tropical oils, such as coconut, palm kernel, or palm oil. Seasonings and condiments Onion salt, garlic salt, seasoned salt, table salt, and sea salt. Worcestershire sauce. Tartar sauce. Barbecue sauce. Teriyaki sauce. Soy sauce, including reduced-sodium. Steak sauce. Canned and packaged gravies. Fish sauce. Oyster sauce. Cocktail sauce. Store-bought horseradish. Ketchup. Mustard. Meat flavorings and tenderizers. Bouillon cubes. Hot sauces. Pre-made or packaged marinades. Pre-made or packaged taco seasonings. Relishes. Regular salad dressings. Other foods Salted popcorn and pretzels. The items listed above may not be a complete list of foods and beverages you should avoid. Contact a dietitian for more information. Where to find more information  National Heart, Lung, and Blood Institute: PopSteam.is  American Heart Association: www.heart.org  Academy of Nutrition and Dietetics:  www.eatright.org  National Kidney Foundation: www.kidney.org Summary  The DASH eating plan is a healthy eating plan that has been shown to reduce high blood pressure (hypertension). It may also reduce your risk for type 2 diabetes, heart disease, and stroke.  When on the DASH eating plan, aim to eat more fresh fruits and vegetables, whole grains, lean proteins, low-fat dairy, and heart-healthy fats.  With the DASH eating plan,  you should limit salt (sodium) intake to 2,300 mg a day. If you have hypertension, you may need to reduce your sodium intake to 1,500 mg a day.  Work with your health care provider or dietitian to adjust your eating plan to your individual calorie needs. This information is not intended to replace advice given to you by your health care provider. Make sure you discuss any questions you have with your health care provider. Document Revised: 01/03/2019 Document Reviewed: 01/03/2019 Elsevier Patient Education  2021 [employer].

## 2020-04-01 NOTE — Assessment & Plan Note (Signed)
Start losartan 50mg  daily.  Instructed to follow low sodium diet and given information on DASH diet.

## 2020-04-02 ENCOUNTER — Telehealth: Payer: Self-pay

## 2020-04-02 LAB — LIPID PANEL
Cholesterol: 199 mg/dL (ref <200)
HDL: 43 mg/dL (ref >=40)
LDL Cholesterol (Calc): 136 mg/dL (calc) — ABNORMAL HIGH
Non-HDL Cholesterol (Calc): 156 mg/dL (calc) — ABNORMAL HIGH (ref <130)
Total CHOL/HDL Ratio: 4.6 (calc) (ref <5.0)
Triglycerides: 98 mg/dL (ref <150)

## 2020-04-02 LAB — COMPLETE METABOLIC PANEL WITH GFR
AG Ratio: 1.4 (calc) (ref 1.0–2.5)
ALT: 48 U/L — ABNORMAL HIGH (ref 9–46)
AST: 30 U/L (ref 10–40)
Albumin: 4.2 g/dL (ref 3.6–5.1)
Alkaline phosphatase (APISO): 67 U/L (ref 36–130)
BUN: 15 mg/dL (ref 7–25)
CO2: 28 mmol/L (ref 20–32)
Calcium: 9.5 mg/dL (ref 8.6–10.3)
Chloride: 102 mmol/L (ref 98–110)
Creat: 0.95 mg/dL (ref 0.60–1.35)
GFR, Est African American: 112 mL/min/{1.73_m2} (ref >=60)
GFR, Est Non African American: 96 mL/min/{1.73_m2} (ref >=60)
Globulin: 3.1 g/dL (calc) (ref 1.9–3.7)
Glucose, Bld: 129 mg/dL — ABNORMAL HIGH (ref 65–99)
Potassium: 4.2 mmol/L (ref 3.5–5.3)
Sodium: 138 mmol/L (ref 135–146)
Total Bilirubin: 0.4 mg/dL (ref 0.2–1.2)
Total Protein: 7.3 g/dL (ref 6.1–8.1)

## 2020-04-02 LAB — CBC
HCT: 43 % (ref 38.5–50.0)
Hemoglobin: 14.5 g/dL (ref 13.2–17.1)
MCH: 30.6 pg (ref 27.0–33.0)
MCV: 90.7 fL (ref 80.0–100.0)
MPV: 10.4 fL (ref 7.5–12.5)
Platelets: 257 10*3/uL (ref 140–400)
RDW: 11.8 % (ref 11.0–15.0)
WBC: 9.4 10*3/uL (ref 3.8–10.8)

## 2020-04-02 LAB — TSH: TSH: 2.86 mIU/L (ref 0.40–4.50)

## 2020-04-02 LAB — HEMOGLOBIN A1C
Hgb A1c MFr Bld: 6.5 % of total Hgb — ABNORMAL HIGH (ref <5.7)
Mean Plasma Glucose: 140 mg/dL
eAG (mmol/L): 7.7 mmol/L

## 2020-04-02 NOTE — Telephone Encounter (Signed)
Pt called back stating he wants to start the Metformin. Still considering a statin for cholesterol.   Please place metformin order.

## 2020-04-05 ENCOUNTER — Other Ambulatory Visit: Payer: Self-pay | Admitting: Family Medicine

## 2020-04-05 MED ORDER — METFORMIN HCL ER 500 MG PO TB24: 500.0000 mg | ORAL_TABLET | Freq: Every day | ORAL | 1 refills | Status: DC

## 2020-04-05 NOTE — Telephone Encounter (Signed)
Completed.

## 2020-04-13 ENCOUNTER — Ambulatory Visit (INDEPENDENT_AMBULATORY_CARE_PROVIDER_SITE_OTHER): Payer: Self-pay | Admitting: Family Medicine

## 2020-04-20 DIAGNOSIS — Z0289 Encounter for other administrative examinations: Secondary | ICD-10-CM

## 2020-04-27 ENCOUNTER — Other Ambulatory Visit: Payer: Self-pay

## 2020-04-27 ENCOUNTER — Ambulatory Visit (INDEPENDENT_AMBULATORY_CARE_PROVIDER_SITE_OTHER): Payer: Self-pay | Admitting: Family Medicine

## 2020-04-27 ENCOUNTER — Ambulatory Visit (INDEPENDENT_AMBULATORY_CARE_PROVIDER_SITE_OTHER): Payer: Managed Care, Other (non HMO) | Admitting: Bariatrics

## 2020-04-27 ENCOUNTER — Encounter (INDEPENDENT_AMBULATORY_CARE_PROVIDER_SITE_OTHER): Payer: Self-pay | Admitting: Bariatrics

## 2020-04-27 VITALS — BP 124/73 | HR 85 | Temp 98.2°F | Ht 73.0 in | Wt >= 6400 oz

## 2020-04-27 DIAGNOSIS — Z9189 Other specified personal risk factors, not elsewhere classified: Secondary | ICD-10-CM | POA: Diagnosis not present

## 2020-04-27 DIAGNOSIS — E559 Vitamin D deficiency, unspecified: Secondary | ICD-10-CM | POA: Diagnosis not present

## 2020-04-27 DIAGNOSIS — Z1331 Encounter for screening for depression: Secondary | ICD-10-CM

## 2020-04-27 DIAGNOSIS — R0602 Shortness of breath: Secondary | ICD-10-CM | POA: Diagnosis not present

## 2020-04-27 DIAGNOSIS — Z6841 Body Mass Index (BMI) 40.0 and over, adult: Secondary | ICD-10-CM

## 2020-04-27 DIAGNOSIS — F509 Eating disorder, unspecified: Secondary | ICD-10-CM

## 2020-04-27 DIAGNOSIS — Z0289 Encounter for other administrative examinations: Secondary | ICD-10-CM

## 2020-04-27 DIAGNOSIS — M25872 Other specified joint disorders, left ankle and foot: Secondary | ICD-10-CM

## 2020-04-27 DIAGNOSIS — E669 Obesity, unspecified: Secondary | ICD-10-CM

## 2020-04-27 DIAGNOSIS — R5383 Other fatigue: Secondary | ICD-10-CM | POA: Diagnosis not present

## 2020-04-27 DIAGNOSIS — E66813 Obesity, class 3: Secondary | ICD-10-CM

## 2020-04-27 DIAGNOSIS — G4730 Sleep apnea, unspecified: Secondary | ICD-10-CM

## 2020-04-27 DIAGNOSIS — I10 Essential (primary) hypertension: Secondary | ICD-10-CM | POA: Diagnosis not present

## 2020-04-27 DIAGNOSIS — E1169 Type 2 diabetes mellitus with other specified complication: Secondary | ICD-10-CM

## 2020-04-28 LAB — C-PEPTIDE: C-Peptide: 6 ng/mL — ABNORMAL HIGH (ref 1.1–4.4)

## 2020-04-28 LAB — INSULIN, RANDOM: INSULIN: 53.8 u[IU]/mL — ABNORMAL HIGH (ref 2.6–24.9)

## 2020-04-28 LAB — MICROALBUMIN / CREATININE URINE RATIO
Creatinine, Urine: 164 mg/dL
Microalb/Creat Ratio: 11 mg/g creat (ref 0–29)
Microalbumin, Urine: 18.4 ug/mL

## 2020-04-28 LAB — VITAMIN D 25 HYDROXY (VIT D DEFICIENCY, FRACTURES): Vit D, 25-Hydroxy: 13.5 ng/mL — ABNORMAL LOW (ref 30.0–100.0)

## 2020-04-28 NOTE — Progress Notes (Unsigned)
Dear Dr. Ashley Royalty,   Thank you for referring Ralph Small to our clinic. The following note includes my evaluation and treatment recommendations.  Chief Complaint:   OBESITY [name] (MR# [id number]) is a 45 y.o. male who presents for evaluation and treatment of obesity and related comorbidities. Current BMI is Body mass index is 69.4 kg/m. Ralph Small has been struggling with his weight for many years and has been unsuccessful in either losing weight, maintaining weight loss, or reaching his healthy weight goal.  Ralph Small is currently in the action stage of change and ready to dedicate time achieving and maintaining a healthier weight. Ralph Small is interested in becoming our patient and working on intensive lifestyle modifications including (but not limited to) diet and exercise for weight loss.  Ralph Small like to [employer] but notes obstacles, as long work days. He craves takeout and carbohydrates.  Ralph Small's habits were reviewed today and are as follows: His family eats meals together, he thinks his family will eat healthier with him, his desired weight loss is 246 lbs, he has been heavy most of his life, he started gaining weight as a child, his heaviest weight ever was 535 pounds, he is a picky eater and doesn't like to eat healthier foods, he has significant food cravings issues, he snacks frequently in the evenings, he skips meals frequently, he is frequently drinking liquids with calories, he frequently makes poor food choices, he frequently eats larger portions than normal, he has binge eating behaviors and he struggles with emotional eating.  Depression Screen [name]'s Food and Mood (modified PHQ-9) score was 18.  Depression screen PHQ 2/9 04/27/2020  Decreased Interest 2  Down, Depressed, Hopeless 1  PHQ - 2 Score 3  Altered sleeping 3  Tired, decreased energy 3  Change in appetite 2  Feeling bad or failure about yourself  3  Trouble concentrating 3  Moving slowly or  fidgety/restless 1  Suicidal thoughts 0  PHQ-9 Score 18  Difficult doing work/chores Somewhat difficult   Subjective:   1. Other fatigue [name] admits to daytime somnolence and admits to waking up still tired. Patent has a history of symptoms of daytime fatigue and morning headache. [name] generally gets 4 or 5 hours of sleep per night, and states that he has poor sleep quality. Snoring is present. Apneic episodes are present. Epworth Sleepiness Score is 5.  2. SOB (shortness of breath) on exertion [name] notes increasing shortness of breath with exercising and seems to be worsening over time with weight gain. He notes getting out of breath sooner with activity than he used to. This has gotten worse recently. [name] denies shortness of breath at rest or orthopnea.  3. Essential hypertension [name]'s BP is slightly elevated today.  BP Readings from Last 3 Encounters:  04/27/20 124/73  04/01/20 (!) 152/73  03/18/20 (!) 162/85   4. Ankle impingement syndrome, left Pt reports it hurts to walk.  5. Diabetes mellitus type 2 in obese (HCC) Pt is taking Metformin. His A1c was 6.5 in 04/01/2020.  6. Vitamin D deficiency Pt does not take vitamins consistently.  7. Eating disorder, unspecified type Pt reports stress and emotional eating.  8. Sleep apnea, unspecified type Pt has an appointment at the end of the month.  9. At risk for activity intolerance Masoud is at risk for exercise intolerance due to obesity and ankle impingement syndrome.  Assessment/Plan:   1. Other fatigue Ralph Small does feel that his weight is causing his energy to be  lower than it should be. Fatigue may be related to obesity, depression or many other causes. Labs will be ordered, and in the meanwhile, Ralph Small will focus on self care including making healthy food choices, increasing physical activity and focusing on stress reduction. Check labs today.  - EKG 12-Lead - VITAMIN D 25 Hydroxy (Vit-D Deficiency,  Fractures)  2. SOB (shortness of breath) on exertion Ralph Small does feel that he gets out of breath more easily that he used to when he exercises. Ralph Small's shortness of breath appears to be obesity related and exercise induced. He has agreed to work on weight loss and gradually increase exercise to treat his exercise induced shortness of breath. Will continue to monitor closely.  3. Essential hypertension Ralph Small is working on healthy weight loss and exercise to improve blood pressure control. We will watch for signs of hypotension as he continues his lifestyle modifications. Continue current treatment plan.  4. Ankle impingement syndrome, left No pounding exercises and pt will wear good shoes.  5. Diabetes mellitus type 2 in obese (HCC) Good blood sugar control is important to decrease the likelihood of diabetic complications such as nephropathy, neuropathy, limb loss, blindness, coronary artery disease, and death. Intensive lifestyle modification including diet, exercise and weight loss are the first line of treatment for diabetes. Continue Metformin. Check labs today.  - Insulin, random - Microalbumin / creatinine urine ratio - C-peptide  6. Vitamin D deficiency Low Vitamin D level contributes to fatigue and are associated with obesity, breast, and colon cancer. He agrees to follow-up for routine testing of Vitamin D, at least 2-3 times per year to avoid over-replacement. Check labs today.  - VITAMIN D 25 Hydroxy (Vit-D Deficiency, Fractures)  7. Eating disorder, unspecified type Offered to see Dr. Dewaine Conger. Handout provided on Emotional Eating.  8. Sleep apnea, unspecified type Pt will have sleep study.  9. Depression screen Ralph Small had a positive depression screening. Depression is commonly associated with obesity and often results in emotional eating behaviors. We will monitor this closely and work on CBT to help improve the non-hunger eating patterns. Referral to Psychology may be required  if no improvement is seen as he continues in our clinic.  10. At risk for activity intolerance Ralph Small was given approximately 15 minutes of exercise intolerance counseling today. He is 44 y.o. male and has risk factors exercise intolerance including obesity. We discussed intensive lifestyle modifications today with an emphasis on specific weight loss instructions and strategies. Tagen will slowly increase activity as tolerated.  Repetitive spaced learning was employed today to elicit superior memory formation and behavioral change.  11. Class 3 severe obesity due to excess calories with serious comorbidity and body mass index (BMI) of 60.0 to 69.9 in adult [facility]) Garrit is currently in the action stage of change and his goal is to continue with weight loss efforts. I recommend Geno begin the structured treatment plan as follows:  He has agreed to the Category 4 Plan.  Reviewed 04/01/2020 labs with pt.  Exercise goals: No exercise has been prescribed at this time.   Behavioral modification strategies: increasing lean protein intake, decreasing simple carbohydrates, increasing vegetables, increasing water intake, decreasing eating out, no skipping meals, meal planning and cooking strategies, keeping healthy foods in the home and planning for success.  He was informed of the importance of frequent follow-up visits to maximize his success with intensive lifestyle modifications for his multiple health conditions. He was informed we would discuss his lab results at his next visit  unless there is a critical issue that needs to be addressed sooner. Jakaleb agreed to keep his next visit at the agreed upon time to discuss these results.  Objective:   Blood pressure 124/73, pulse 85, temperature 98.2 F (36.8 C), height 6\' 1"  (1.854 m), weight (!) 526 lb (238.6 kg), SpO2 97 %. Body mass index is 69.4 kg/m.  EKG: Normal sinus rhythm, rate 80.  Indirect Calorimeter completed today shows a VO2 of 629 and a  REE of 4380.  His calculated basal metabolic rate is thus his basal metabolic rate is better than expected.  General: Cooperative, alert, well developed, in no acute distress. HEENT: Conjunctivae and lids unremarkable. Cardiovascular: Regular rhythm.  Lungs: Normal work of breathing. Neurologic: No focal deficits.   Lab Results  Component Value Date   CREATININE 0.95 04/01/2020   BUN 15 04/01/2020   NA 138 04/01/2020   K 4.2 04/01/2020   CL 102 04/01/2020   CO2 28 04/01/2020   Lab Results  Component Value Date   ALT 48 (H) 04/01/2020   AST 30 04/01/2020   BILITOT 0.4 04/01/2020   Lab Results  Component Value Date   HGBA1C 6.5 (H) 04/01/2020   HGBA1C 5.7 (H) 06/16/2019   Lab Results  Component Value Date   INSULIN 53.8 (H) 04/27/2020   Lab Results  Component Value Date   TSH 2.86 04/01/2020   Lab Results  Component Value Date   CHOL 199 04/01/2020   HDL 43 04/01/2020   LDLCALC 136 (H) 04/01/2020   TRIG 98 04/01/2020   CHOLHDL 4.6 04/01/2020   Lab Results  Component Value Date   WBC 9.4 04/01/2020   HGB 14.5 04/01/2020   HCT 43.0 04/01/2020   MCV 90.7 04/01/2020   PLT 257 04/01/2020     Attestation Statements:   Reviewed by clinician on day of visit: allergies, medications, problem list, medical history, surgical history, family history, social history, and previous encounter notes.  04/03/2020, am acting as Edmund Hilda for Energy manager, DO.  I have reviewed the above documentation for accuracy and completeness, and I agree with the above. [employer], DO

## 2020-04-29 ENCOUNTER — Encounter (INDEPENDENT_AMBULATORY_CARE_PROVIDER_SITE_OTHER): Payer: Self-pay | Admitting: Bariatrics

## 2020-04-29 DIAGNOSIS — E559 Vitamin D deficiency, unspecified: Secondary | ICD-10-CM | POA: Insufficient documentation

## 2020-05-05 ENCOUNTER — Encounter: Payer: Self-pay | Admitting: Neurology

## 2020-05-05 ENCOUNTER — Ambulatory Visit: Payer: Managed Care, Other (non HMO) | Admitting: Neurology

## 2020-05-05 VITALS — BP 143/81 | HR 85 | Ht 73.0 in | Wt >= 6400 oz

## 2020-05-05 DIAGNOSIS — E669 Obesity, unspecified: Secondary | ICD-10-CM

## 2020-05-05 DIAGNOSIS — M25872 Other specified joint disorders, left ankle and foot: Secondary | ICD-10-CM

## 2020-05-05 DIAGNOSIS — G4733 Obstructive sleep apnea (adult) (pediatric): Secondary | ICD-10-CM

## 2020-05-05 DIAGNOSIS — G4734 Idiopathic sleep related nonobstructive alveolar hypoventilation: Secondary | ICD-10-CM

## 2020-05-05 DIAGNOSIS — F519 Sleep disorder not due to a substance or known physiological condition, unspecified: Secondary | ICD-10-CM

## 2020-05-05 DIAGNOSIS — E662 Morbid (severe) obesity with alveolar hypoventilation: Secondary | ICD-10-CM

## 2020-05-05 DIAGNOSIS — R0601 Orthopnea: Secondary | ICD-10-CM

## 2020-05-05 DIAGNOSIS — Z6841 Body Mass Index (BMI) 40.0 and over, adult: Secondary | ICD-10-CM

## 2020-05-05 NOTE — Patient Instructions (Signed)
Hypersomnia Hypersomnia is a condition in which a person feels very tired during the day even though he or she gets plenty of sleep at night. A person with this condition may take naps during the day and may find it very difficult to wake up from sleep. Hypersomnia may affect a person's ability to think, concentrate, drive, or remember things. What are the causes? The cause of this condition may not be known. Possible causes include:  Certain medicines.  Sleep disorders, such as narcolepsy and sleep apnea.  Injury to the head, brain, or spinal cord.  Drug or alcohol use.  Gastroesophageal reflux disease (GERD).  Tumors.  Certain medical conditions, such as depression, diabetes, or an underactive thyroid gland (hypothyroidism). What are the signs or symptoms? The main symptoms of hypersomnia include:  Feeling very tired throughout the day, regardless of how much sleep you got the night before.  Having trouble waking up. Others may find it difficult to wake you up when you are sleeping.  Sleeping for longer and longer periods at a time.  Taking naps throughout the day. Other symptoms may include:  Feeling restless, anxious, or annoyed.  Lacking energy.  Having trouble with: ? Remembering. ? Speaking. ? Thinking.  Loss of appetite.  Seeing, hearing, tasting, smelling, or feeling things that are not real (hallucinations). How is this diagnosed? This condition may be diagnosed based on:  Your symptoms and medical history.  Your sleeping habits. Your health care provider may ask you to write down your sleeping habits in a daily sleep log, along with any symptoms you have.  A series of tests that are done while you sleep (sleep study or polysomnogram).  A test that measures how quickly you can fall asleep during the day (daytime nap study or multiple sleep latency test). How is this treated? Treatment can help you manage your condition. Treatment may  include:  Following a regular sleep routine.  Lifestyle changes, such as changing your eating habits, getting regular exercise, and avoiding alcohol or caffeinated beverages.  Taking medicines to make you more alert (stimulants) during the day.  Treating any underlying medical causes of hypersomnia. Follow these instructions at home: Sleep routine  Schedule the same bedtime and wake-up time each day.  Practice a relaxing bedtime routine. This may include reading, meditation, deep breathing, or taking a warm bath before going to sleep.  Get regular exercise each day. Avoid strenuous exercise in the evening hours.  Keep your sleep environment at a cooler temperature, darkened, and quiet.  Sleep with pillows and a mattress that are comfortable and supportive.  Schedule short 20-minute naps for when you feel sleepiest during the day.  Talk with your employer or teachers about your hypersomnia. If possible, adjust your schedule so that: ? You have a regular daytime work schedule. ? You can take a scheduled nap during the day. ? You do not have to work or be active at night.  Do not eat a heavy meal for a few hours before bedtime. Eat your meals at about the same times every day.  Avoid drinking alcohol or caffeinated beverages.   Safety  Do not drive or use heavy machinery if you are sleepy. Ask your health care provider if it is safe for you to drive.  Wear a life jacket when swimming or spending time near water.   General instructions  Take supplements and over-the-counter and prescription medicines only as told by your health care provider.  Keep a sleep log that   will help your doctor manage your condition. This may include information about: ? What time you go to bed each night. ? How often you wake up at night. ? How many hours you sleep at night. ? How often and for how long you nap during the day. ? Any observations from others, such as leg movements during sleep,  sleep walking, or snoring.  Keep all follow-up visits as told by your health care provider. This is important. Contact a health care provider if:  You have new symptoms.  Your symptoms get worse. Get help right away if:  You have serious thoughts about hurting yourself or someone else. If you ever feel like you may hurt yourself or others, or have thoughts about taking your own life, get help right away. You can go to your nearest emergency department or call:  Your local emergency services (911 in the [place]  A suicide crisis helpline, such as the National Suicide Prevention Lifeline at (670)628-5167. This is open 24 hours a day. Summary  Hypersomnia refers to a condition in which you feel very tired during the day even though you get plenty of sleep at night.  A person with this condition may take naps during the day and may find it very difficult to wake up from sleep.  Hypersomnia may affect a person's ability to think, concentrate, drive, or remember things.  Treatment, such as following a regular sleep routine and making some lifestyle changes, can help you manage your condition. This information is not intended to replace advice given to you by your health care provider. Make sure you discuss any questions you have with your health care provider. Document Revised: 12/11/2019 Document Reviewed: 12/11/2019 Elsevier Patient Education  2021 Elsevier Inc. [email or web address].com.[email or web address]">  Obesity-Hypoventilation Syndrome Obesity-hypoventilation syndrome (OHS) is a condition in which a person cannot efficiently move air in and out of the lungs (ventilate). This condition causes hypoventilation, which means the blood will have a buildup of carbon dioxideand a drop in oxygen levels. Key factors for OHS include having too much body fat, or obesity, and having high levels of awake daytime carbon dioxide  (hypercapnia). OHS can increase the risk for:  Cor pulmonale, or right-sided heart failure.  Congestive heart failure.  Pulmonary hypertension, or high blood pressure in the arteries in the lungs.  Too many red blood cells in the body.  Disability or death. What are the causes? This condition may be caused by:  Being obese with a BMI (body mass index) greater than or equal to 30 kg/m2.  Having too much fat around the abdomen, chest, and neck.  The brain being unable to properly manage the high carbon dioxide and low oxygen levels.  Hormones made by fat cells. These hormones may interfere with breathing.  Sleep apnea. This is when breathing stops, pauses, or is shallow during sleep. What are the signs or symptoms? Symptoms of this condition include:  Feeling sleepy during the day.  Headaches. These may be worse in the morning.  Shortness of breath.  Snoring, choking, gasping, or trouble breathing during sleep.  Poor concentration or poor memory.  Mood changes or feeling irritable.  Depression. How is this diagnosed? This condition may be diagnosed by:  BMI measurement.  Blood tests to measure blood levels of serum bicarbonate, carbon dioxide, and oxygen.  Pulse oximetry to measure the amount of oxygen in your blood. This uses a small device that is placed on your finger, earlobe, or toe.  Polysomnogram, or sleep study,  to check your breathing patterns and levels of oxygen and carbon dioxide while you sleep. You may also have other tests, including:  A chest X-ray to rule out other breathing problems.  Lung tests, or pulmonary function tests, to rule out other breathing problems.  ECG (electrocardiogram) or echocardiogram to check for signs of heart failure. How is this treated? This condition may be treated with:  A device such as a continuous positive airway pressure (CPAP) machine or a bi-level positive airway pressure (BPAP) machine. These devices deliver  pressure and sometimes oxygen to make breathing easier. A mask may be placed over your nose or mouth.  Oxygen if your blood oxygen levels are low.  A weight-loss program.  Bariatric, or weight-loss, surgery.  Tracheostomy. A tube is placed in the windpipe through the neck to help with breathing.   Follow these instructions at home: Medicines  Take over-the-counter and prescription medicines only as told by your health care provider.  Ask your health care provider what medicines are safe for you. You may be told to avoid medicines such as sedatives and narcotics. These can affect breathing and make OHS worse. Sleeping habits  If you are prescribed a CPAP or a BPAP machine, make sure you understand how to use it. Use your CPAP or BPAP machine only as told by your health care provider.  Try to get at least 8 hours of sleep every night. Eating and drinking  Eat foods that are high in fiber, such as beans, whole grains, and fresh fruits and vegetables.  Limit foods that are high in fat and processed sugars, such as fried or sweet foods.  Drink enough fluid to keep your urine pale yellow.  Do not drink alcohol if: ? Your health care provider tells you not to drink. ? You are pregnant, may be pregnant, or are planning to become pregnant.   General instructions  Follow a diet and exercise plan that helps you reach and keep a healthy weight as told by your health care provider.  Exercise regularly as told by your health care provider.  Do not use any products that contain nicotine or tobacco, such as cigarettes, e-cigarettes, and chewing tobacco. If you need help quitting, ask your health care provider.  Keep all follow-up visits as told by your health care provider. This is important.   Contact a health care provider if:  You develop new or worsening shortness of breath.  You are having trouble waking up or staying awake.  You are confused.  You have chest pain.  You have  fast or irregular heartbeats.  You are dizzy or you faint.  You develop a cough.  You have a fever. Get help right away if: You have any symptoms of a stroke. "BE FAST" is an easy way to remember the main warning signs of a stroke:  B - Balance. Signs are dizziness, sudden trouble walking, or loss of balance.  E - Eyes. Signs are trouble seeing or a sudden change in vision.  F - Face. Signs are sudden weakness or numbness of the face, or the face or eyelid drooping on one side.  A - Arms. Signs are weakness or numbness in an arm. This happens suddenly and usually on one side of the body.  S - Speech. Signs are sudden trouble speaking, slurred speech, or trouble understanding what people say.  T - Time. Time to call emergency services. Write down what time symptoms started. You have other signs of a  stroke, such as:  A sudden, severe headache with no known cause.  Nausea or vomiting.  Seizure. These symptoms may represent a serious problem that is an emergency. Do not wait to see if the symptoms will go away. Get medical help right away. Call your local emergency services (911 in the [place] Do not drive yourself to the hospital. If you ever feel like you may hurt yourself or others, or have thoughts about taking your own life, get help right away. Go to your nearest emergency department or:  Call your local emergency services (911 in the [place]  Call a suicide crisis helpline, such as the National Suicide Prevention Lifeline at 669-532-4106. This is open 24 hours a day in the [place]  Text the Crisis Text Line at 609-475-1046 (in the [place] Summary  Obesity-hypoventilation syndrome (OHS) causes hypoventilation, which means the blood will have a buildup of carbon dioxideand a drop in oxygen levels.  Key factors for OHS include having too much body fat, or obesity, and having high levels of awake daytime carbon dioxide (hypercapnia).  OHS can increase the risk for heart failure,  pulmonary hypertension, disability, and death.  Follow your diet and exercise plan as told by your health care provider. This information is not intended to replace advice given to you by your health care provider. Make sure you discuss any questions you have with your health care provider. Document Revised: 01/24/2019 Document Reviewed: 01/24/2019 Elsevier Patient Education  2021 [employer].

## 2020-05-05 NOTE — Progress Notes (Signed)
SLEEP MEDICINE CLINIC    Provider:  Melvyn Novas, MD  Primary Care Physician:  Everrett Coombe, DO [address] [address] [place] [address]     Referring Provider: Everrett Coombe, Do [address] [place],  [place] [address]          Chief Complaint according to patient   Patient presents with:    . New Patient (Initial Visit)     Pt alone, rm 11. Pt states that he is on the weight loss journey and his wife has informed him that he has apnea events. Wakes up 2-3 times a night to void. Only avg 5-6 hrs of sleep (broken). Never had a SS. Fatigue and tired all the time.      HISTORY OF PRESENT ILLNESS:  Ralph Small is a 45 - year old  Caucasian male patient seen here upon referral bu Dr Everrett Coombe on 05/05/2020  for a Sleep Consultation.   Chief concern according to patient :  Pt alone, rm 11. Pt states that he is on the weight loss journey and his wife has informed him that he has apnea events. Wakes up 2-3 times a night to void. Only avg 5-6 hrs of sleep (broken). Never had a SS. Fatigue and tired all the time.   Ralph Small  has a medical history of super-obesity, orthopnea, morning headaches, vascular headaches,  Hypertension,  and Prediabetes.  Superobesity ,left  ankle arthritis,     Sleep relevant medical history: Nocturia 3, no Tonsillectomy,    Family medical /sleep history: No other family member on CPAP with OSA, insomnia, sleep walkers.    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 Family status is married  Without children, 2 dogs and one cat. He is still grieving, his mother died in January 01, 2023. He found her on her kitchen floor.  The patient currently works from home since 04/2018.  He works 8 AM to 8 PM.  Tobacco use: 2011.  ETOH use , Caffeine intake in form of Coffee(2-3 cups in AM ). Regular exercise in form of  Walking.      Sleep habits are as follows: The patient's dinner time is between 8-9 PM. The patient goes to bed at 10.30 PM and continues to sleep for intervals of 1-2  hours, wakes for 3 bathroom breaks, the first time at 2 AM.   The preferred sleep position is elevated , supine with the support of several  pillows. " If I sleep on my side my arms go to sleep"  I have GERD-  I cant sleep flat, palpitations. Waking choking".  Dreams are reportedly frequent/vivid- some nightmares.  7 AM is the usual rise time.The patient wakes up with an alarm.  He  reports not feeling refreshed or restored in AM, with symptoms such as dry mouth , some morning headaches, and residual fatigue. His BP was elevated.  Naps are taken infrequently, only at home, weekends, lasting from 15-45 minutes.    Review of Systems: Out of a complete 14 system review, the patient complains of only the following symptoms, and all other reviewed systems are negative.:  Fatigue, sleepiness , snoring, fragmented sleep,nocturia, choking.   How likely are you to doze in the following situations: 0 = not likely, 1 = slight chance, 2 = moderate chance, 3 = high chance  Sitting and Reading? Watching Television? Sitting inactive in a public place (theater or meeting)? As a passenger in a car for an hour without a break? Lying down in the afternoon when circumstances permit? Sitting and talking to someone? Sitting quietly after lunch without alcohol? In a car, while stopped for a few minutes in traffic?   Total = 15/ 24 points   FSS endorsed at 49/ 63 points.   Social History   Socioeconomic History  . Marital status: Married    Spouse name: Marylene Land  . Number of children: Not on file  . Years of education: Not on file  . Highest education level: Not on file  Occupational History  . Occupation: Coordinator    Comment: [employer]  Tobacco Use  . Smoking status: Former Smoker    Packs/day: 0.50    Years: 15.00    Pack years: 7.50     Types: Cigarettes    Quit date: 10/13/2009    Years since quitting: 10.5  . Smokeless tobacco: Never Used  Vaping Use  . Vaping Use: Every day  Substance and Sexual Activity  . Alcohol use: Yes    Comment: Rarely  . Drug use: Never  . Sexual activity: Not Currently    Partners: Female    Birth control/protection: Abstinence  Other Topics Concern  . Not on file  Social History Narrative  . Not on file   Social Determinants of Health   Financial Resource Strain: Not on file  Food Insecurity: Not on file  Transportation Needs: Not on file  Physical Activity: Not on file  Stress: Not on file  Social Connections: Not on file    Family History  Problem Relation Age of Onset  . Diabetes Mother   . Hypertension Mother   . High Cholesterol Mother   . Heart disease Mother   . Obesity Mother   . Heart attack Father   . High blood pressure Father   . High Cholesterol Father   . Heart disease Father   . Breast cancer Maternal Aunt     Past Medical History:  Diagnosis Date  . Chlamydia   . Hypertension   . Morbid obesity (HCC)   . Prediabetes     Past Surgical History:  Procedure Laterality Date  . HERNIA REPAIR       Current Outpatient Medications on File Prior to Visit  Medication Sig Dispense Refill  . losartan (COZAAR) 50 MG tablet Take 1 tablet (50 mg total) by mouth daily. 90 tablet 3  . metFORMIN (GLUCOPHAGE XR) 500 MG 24 hr tablet Take 1 tablet (500 mg total) by mouth daily with breakfast. 90 tablet 1   No current facility-administered medications on file prior to visit.    Physical exam:  Today's Vitals   05/05/20 0853  BP: (!) 143/81  Pulse: 85  Weight: (!) 533 lb (241.8 kg)  Height: 6\' 1"  (1.854 m)   Body mass index is 70.32 kg/m.   Wt Readings from Last 3 Encounters:  05/05/20 (!) 533 lb (241.8 kg)  04/27/20 (!) 526 lb (238.6 kg)  04/01/20 (!) 536 lb (243.1 kg)     Ht Readings from Last 3 Encounters:  05/05/20 6\' 1"  (1.854 m)   04/27/20 6\' 1"  (1.854 m)  03/18/20 6\' 2"  (1.88 m)      General: The patient is awake, alert and appears not in acute distress. The patient is well groomed. Head: Normocephalic, atraumatic. Neck is supple. Mallampati narrow and small- 2,  neck circumference:23 inches.  Nasal airflow patent.   Retrognathia is not seen.  Dental status: intact.  Cardiovascular:  Regular rate and cardiac rhythm by pulse,  without distended neck veins. Respiratory: Lungs are clear to auscultation.  Skin: evidence of ankle edema, or rash. Trunk: The patient's posture is erect.   Neurologic exam : The patient is awake and alert, oriented to place and time.   Memory subjective described as intact.  Attention span & concentration ability appears normal.  Speech is fluent,  without  dysarthria, dysphonia or aphasia.  Mood and affect are appropriate.   Cranial nerves: no loss of smell or taste reported -  Pupils are equal and briskly reactive to light. Funduscopic exam deferred.   Extraocular movements in vertical and horizontal planes were intact and without nystagmus. No Diplopia. Visual fields by finger perimetry are intact. Hearing was intact to soft voice and finger rubbing.    Facial sensation intact to fine touch.  Facial motor strength is symmetric and tongue and uvula move midline.  Neck ROM : rotation, tilt and flexion extension were normal for age and shoulder shrug was symmetrical.    Motor exam:  Symmetric bulk, tone and ROM.   Normal tone without cog wheeling, symmetric grip strength .   Sensory:  vibration  Decreased on the left leg, foot.  Proprioception tested in the upper extremities was normal.   Coordination: Rapid alternating movements in the fingers/hands were of normal speed.  The Finger-to-nose maneuver was intact without evidence of ataxia, dysmetria or tremor.   Gait and station: Patient could rise unassisted from a seated position, walked without assistive device.  Stance is  of wider base .  Toe and heel walk were deferred.  Deep tendon reflexes: in the  upper and lower extremities are symmetric and intact.  Babinski response was deferred.       After spending a total time of  45  minutes face to face and additional time for physical and neurologic examination, review of laboratory studies,  personal review of imaging studies, reports and results of other testing and review of referral information / records as far as provided in visit, I have established the following assessments:  1) Superobesity - followed by Dr Dalbert Garnet. He is now on metformin.  2) cleary orthopnea is being present, and choking, snoring, having daytime sleepiness speak for apnea . 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 4) constant fatigued, unrefreshed, and with vascular headaches- HTN and caffeine related.     My Plan is to proceed with:  1) I would much prefer an attended SPLIT night polysomnography, as hypoxemia is like present as well as severe apnea.  2) if [employer] does not permit this ( as usual) I will order a HST and follow him for CPAP trial.  3) I will ask for bed 2 in the sleep lab if he can come in.   I would like to thank Everrett Coombe, DO and Everrett Coombe, Do [address] [place],  [place] [address] for allowing me to meet with and to take care of this pleasant patient.   In short, Ralph Small is presenting with EDS, choking, GERD, HTN and orthopnea.  I plan to follow up either personally or through our NP within 2-4 month.     Electronically signed by: Melvyn Novas, MD 05/05/2020 9:20 AM  [facility] Board certified by The [employer] of Sleep Medicine and Diplomate of  the American Academy of Sleep Medicine. Board certified In Neurology through the [employer], Fellow of the [employer] of Neurology. Medical Director of [employer].

## 2020-05-06 ENCOUNTER — Ambulatory Visit: Payer: Managed Care, Other (non HMO)

## 2020-05-11 ENCOUNTER — Other Ambulatory Visit: Payer: Self-pay

## 2020-05-11 ENCOUNTER — Ambulatory Visit (INDEPENDENT_AMBULATORY_CARE_PROVIDER_SITE_OTHER): Payer: Managed Care, Other (non HMO) | Admitting: Bariatrics

## 2020-05-11 ENCOUNTER — Ambulatory Visit (INDEPENDENT_AMBULATORY_CARE_PROVIDER_SITE_OTHER): Payer: Managed Care, Other (non HMO) | Admitting: Family Medicine

## 2020-05-11 VITALS — BP 139/65 | HR 86 | Temp 98.1°F | Resp 20 | Ht 73.0 in | Wt >= 6400 oz

## 2020-05-11 DIAGNOSIS — I10 Essential (primary) hypertension: Secondary | ICD-10-CM

## 2020-05-11 NOTE — Progress Notes (Signed)
Medical screening examination/treatment was performed by qualified clinical staff member and as supervising physician I was immediately available for consultation/collaboration. I have reviewed documentation and agree with assessment and plan.  Dyane Broberg, DO  

## 2020-05-11 NOTE — Progress Notes (Signed)
Established Patient Office Visit  Subjective:  Patient ID: Ralph Small, male    DOB: 16-Oct-1975  Age: 45 y.o. MRN: [id number]  CC:  Chief Complaint  Patient presents with  . Hypertension    HPI HOLBERT CAPLES presents for a BP check. BP today is 139/65.  Past Medical History:  Diagnosis Date  . Chlamydia   . Hypertension   . Morbid obesity (HCC)   . Prediabetes     Past Surgical History:  Procedure Laterality Date  . HERNIA REPAIR      Family History  Problem Relation Age of Onset  . Diabetes Mother   . Hypertension Mother   . High Cholesterol Mother   . Heart disease Mother   . Obesity Mother   . Heart attack Father   . High blood pressure Father   . High Cholesterol Father   . Heart disease Father   . Breast cancer Maternal Aunt     Social History   Socioeconomic History  . Marital status: Married    Spouse name: Marylene Land  . Number of children: Not on file  . Years of education: Not on file  . Highest education level: Not on file  Occupational History  . Occupation: Coordinator    Comment: [employer]  Tobacco Use  . Smoking status: Former Smoker    Packs/day: 0.50    Years: 15.00    Pack years: 7.50    Types: Cigarettes    Quit date: 10/13/2009    Years since quitting: 10.5  . Smokeless tobacco: Never Used  Vaping Use  . Vaping Use: Every day  Substance and Sexual Activity  . Alcohol use: Yes    Comment: Rarely  . Drug use: Never  . Sexual activity: Not Currently    Partners: Female    Birth control/protection: Abstinence  Other Topics Concern  . Not on file  Social History Narrative  . Not on file   Social Determinants of Health   Financial Resource Strain: Not on file  Food Insecurity: Not on file  Transportation Needs: Not on file  Physical Activity: Not on file  Stress: Not on file  Social Connections: Not on file  Intimate Partner Violence: Not on file    Outpatient Medications Prior to Visit  Medication Sig  Dispense Refill  . losartan (COZAAR) 50 MG tablet Take 1 tablet (50 mg total) by mouth daily. 90 tablet 3  . metFORMIN (GLUCOPHAGE XR) 500 MG 24 hr tablet Take 1 tablet (500 mg total) by mouth daily with breakfast. 90 tablet 1   No facility-administered medications prior to visit.    No Known Allergies  ROS Review of Systems    Objective:    Physical Exam  BP 139/65 (BP Location: Left Arm, Patient Position: Sitting, Cuff Size: Large)   Pulse 86   Temp 98.1 F (36.7 C) (Temporal)   Resp 20   Ht 6\' 1"  (1.854 m)   Wt (!) 530 lb (240.4 kg)   SpO2 99%   BMI 69.93 kg/m  Wt Readings from Last 3 Encounters:  05/11/20 (!) 530 lb (240.4 kg)  05/05/20 (!) 533 lb (241.8 kg)  04/27/20 (!) 526 lb (238.6 kg)     Health Maintenance Due  Topic Date Due  . COVID-19 Vaccine (1) Never done  . TETANUS/TDAP  Never done  . COLONOSCOPY (Pts 45-10yrs Insurance coverage will need to be confirmed)  Never done    There are no preventive care reminders to display  for this patient.  Lab Results  Component Value Date   TSH 2.86 04/01/2020   Lab Results  Component Value Date   WBC 9.4 04/01/2020   HGB 14.5 04/01/2020   HCT 43.0 04/01/2020   MCV 90.7 04/01/2020   PLT 257 04/01/2020   Lab Results  Component Value Date   NA 138 04/01/2020   K 4.2 04/01/2020   CO2 28 04/01/2020   GLUCOSE 129 (H) 04/01/2020   BUN 15 04/01/2020   CREATININE 0.95 04/01/2020   BILITOT 0.4 04/01/2020   AST 30 04/01/2020   ALT 48 (H) 04/01/2020   PROT 7.3 04/01/2020   CALCIUM 9.5 04/01/2020   Lab Results  Component Value Date   CHOL 199 04/01/2020   Lab Results  Component Value Date   HDL 43 04/01/2020   Lab Results  Component Value Date   LDLCALC 136 (H) 04/01/2020   Lab Results  Component Value Date   TRIG 98 04/01/2020   Lab Results  Component Value Date   CHOLHDL 4.6 04/01/2020   Lab Results  Component Value Date   HGBA1C 6.5 (H) 04/01/2020      Assessment & Plan:  Continue  with current medications.  Problem List Items Addressed This Visit   None     No orders of the defined types were placed in this encounter.   Follow-up: No follow-ups on file.    Kathrynn Speed, CMA

## 2020-05-14 ENCOUNTER — Encounter: Payer: Self-pay | Admitting: Family Medicine

## 2020-06-02 ENCOUNTER — Encounter: Payer: Self-pay | Admitting: Neurology

## 2020-06-17 ENCOUNTER — Encounter (INDEPENDENT_AMBULATORY_CARE_PROVIDER_SITE_OTHER): Payer: Self-pay | Admitting: Bariatrics

## 2020-06-21 NOTE — Telephone Encounter (Signed)
Pt last seen by Dr. Brown.  

## 2020-06-23 ENCOUNTER — Ambulatory Visit (INDEPENDENT_AMBULATORY_CARE_PROVIDER_SITE_OTHER): Payer: Managed Care, Other (non HMO) | Admitting: Neurology

## 2020-06-23 DIAGNOSIS — R0601 Orthopnea: Secondary | ICD-10-CM

## 2020-06-23 DIAGNOSIS — G4733 Obstructive sleep apnea (adult) (pediatric): Secondary | ICD-10-CM

## 2020-06-23 DIAGNOSIS — E662 Morbid (severe) obesity with alveolar hypoventilation: Secondary | ICD-10-CM

## 2020-06-23 DIAGNOSIS — F519 Sleep disorder not due to a substance or known physiological condition, unspecified: Secondary | ICD-10-CM

## 2020-06-23 DIAGNOSIS — Z6841 Body Mass Index (BMI) 40.0 and over, adult: Secondary | ICD-10-CM

## 2020-06-28 NOTE — Progress Notes (Signed)
   [facility]  HOME SLEEP TEST (Watch PAT)  STUDY DATA LOAD: 06/28/20  DOB: [birth year]  MRN: [id number]  ORDERING CLINICIAN: Melvyn Novas, MD   REFERRING CLINICIAN: Everrett Coombe, DO   CLINICAL INFORMATION/HISTORY: Ralph Small  has a medical history of super-obesity, orthopnea, morning headaches, vascular headaches,  Hypertension, and Prediabetes/ Superobesity with left ankle arthritis. He reports severe daytime sleepiness.   Epworth sleepiness score: 15/24. BMI: 70.32 kg/m Neck Circumference: 23"  FINDINGS:   Total Record Time (hours, min): 8 h 0 min Total Sleep Time (hours, min):  6 h 44 min   Percent REM (%):    18.56 %   Calculated pAHI (per hour): 38.3       REM pAHI: 60.6  NREM pAHI: 33.3 Supine AHI: N/A Oxygen Saturation (%) Mean: 91  Minimum oxygen saturation (%):      67    O2 Saturation Range (%): 67-98  O2Saturation (minutes) <=88%: No data (!)  Pulse Mean (bpm):    71  Pulse Range (48-104)   IMPRESSION: This HST indicates the presence of severe OSA (obstructive sleep apnea) but failed to record hypoxemia. There is exacerbation of apnea in REM sleep, but no REM sleep dependency.     RECOMMENDATION: This degree of sleep apnea in a patient with super-obesity requires positive airway pressure therapy. I have to assume that hypoventilation is present. The amount of pressure required to overcome apnea- hypoventilation can be uncomfortable - I would prefer to invite this patient for an attended titration so CPAP or BiPAP can be tried and oxygen can be added as needed.   His insurance is [employer], which leaves [place] with usually with no in- lab options- Plan B will be auto CPAP, 5-10 cm water, 3 cm EPR and mask of patients choice .     INTERPRETING PHYSICIAN:  Melvyn Novas, MD    [facility] and [facility] Sleep Board certified by The [employer] of Sleep Medicine and Diplomate of the [employer] of Sleep Medicine. Board  certified In Neurology through the ABPN, Fellow of the [employer] of Neurology. Medical Director of [facility].   Sleep Summary  Oxygen Saturation Statistics   Start Study Time: End Study Time: Total Recording Time:          12:20:05 AM 8:20:06 AM 8 h, 0 min  Total Sleep Time % REM of Sleep Time:  6 h, 44 min  18.6    Mean: 91 Minimum: 67 Maximum: 98  Mean of Desaturations Nadirs (%):   84  Oxygen Desatur. %:  4-9 10-20 >20 Total  Events Number Total   126  52 69.6 28.7  3 1.7  181 100.0  Oxygen Saturation: <90 <=88 <85 <80 <70  Duration (minutes): Sleep % 93.2 23.1 81.9 50.8 20.3 12.6 20.0 5.0 0.5 0.1     Respiratory Indices      Total Events REM NREM All Night  pRDI: pAHI 3%:  259  248 63.1 60.6 34.8 33.3 40.0 38.3  ODI 4%: pAHI 4%:  181 192 54.7 22.0 28.0 29.7       Pulse Rate Statistics during Sleep (BPM)      Mean: 71 Minimum: 48 Maximum: 104

## 2020-07-06 ENCOUNTER — Ambulatory Visit (INDEPENDENT_AMBULATORY_CARE_PROVIDER_SITE_OTHER): Payer: Managed Care, Other (non HMO) | Admitting: Bariatrics

## 2020-07-06 ENCOUNTER — Other Ambulatory Visit: Payer: Self-pay

## 2020-07-06 ENCOUNTER — Encounter (INDEPENDENT_AMBULATORY_CARE_PROVIDER_SITE_OTHER): Payer: Self-pay | Admitting: Bariatrics

## 2020-07-06 VITALS — BP 140/82 | HR 75 | Temp 98.1°F | Ht 73.0 in | Wt >= 6400 oz

## 2020-07-06 DIAGNOSIS — R7303 Prediabetes: Secondary | ICD-10-CM | POA: Diagnosis not present

## 2020-07-06 DIAGNOSIS — Z9189 Other specified personal risk factors, not elsewhere classified: Secondary | ICD-10-CM | POA: Diagnosis not present

## 2020-07-06 DIAGNOSIS — Z6841 Body Mass Index (BMI) 40.0 and over, adult: Secondary | ICD-10-CM | POA: Diagnosis not present

## 2020-07-06 DIAGNOSIS — E559 Vitamin D deficiency, unspecified: Secondary | ICD-10-CM

## 2020-07-06 MED ORDER — OZEMPIC (0.25 OR 0.5 MG/DOSE) 2 MG/1.5ML ~~LOC~~ SOPN: 0.2500 mg | PEN_INJECTOR | SUBCUTANEOUS | 0 refills | Status: DC

## 2020-07-06 MED ORDER — VITAMIN D (ERGOCALCIFEROL) 1.25 MG (50000 UNIT) PO CAPS: 50000.0000 [IU] | ORAL_CAPSULE | ORAL | 0 refills | Status: DC

## 2020-07-07 ENCOUNTER — Encounter: Payer: Self-pay | Admitting: Neurology

## 2020-07-07 NOTE — Progress Notes (Signed)
Chief Complaint:   OBESITY Ralph Small is here to discuss his progress with his obesity treatment plan along with follow-up of his obesity related diagnoses. Ralph Small is on the Category 4 Plan and states he is following his eating plan approximately 25% of the time. Ralph Small states he is (doing HIIT and aqua?) 45 minutes 2 times per week.  Today's visit was #: 2 Starting weight: 526 lbs Starting date: 04/27/2020 Today's weight: 532 lbs Today's date: 07/06/2020 Total lbs lost to date: 0 Total lbs lost since last in-office visit: 0  Interim History: Ralph Small is up 6 lbs since his  1st appt wt was 526 lbs- this appt wt 532 lbs for a gain of 6 lbs) since his last visit. He states that it has been hard to get into a routine.  Subjective:   1. Vitamin D deficiency Ralph Small is not on a Vit D supplement. His Vit D is 13.5.  2. Pre-diabetes Ralph Small A1c was 6.5 on 04/01/2020. A1c 6.0 and insulin level 53.8.  3. At risk for osteoporosis Ralph Small is at higher risk of osteopenia and osteoporosis due to Vitamin D deficiency.   Assessment/Plan:   1. Vitamin D deficiency Low Vitamin D level contributes to fatigue and are associated with obesity, breast, and colon cancer. He agrees to start to take prescription Vitamin D @50 ,000 IU every week and will follow-up for routine testing of Vitamin D, at least 2-3 times per year to avoid over-replacement.  - Vitamin D, Ergocalciferol, (DRISDOL) 1.25 MG (50000 UNIT) CAPS capsule; Take 1 capsule (50,000 Units total) by mouth every 7 (seven) days.  Dispense: 4 capsule; Refill: 0  2. Pre-diabetes Ralph Small will continue to work on weight loss, exercise, and decreasing simple carbohydrates to help decrease the risk of diabetes.  -continue Metformin -Increase healthy fats and protein -Start Ozempic 0.25 mg, as prescribed below. - Semaglutide,0.25 or 0.5MG /DOS, (OZEMPIC, 0.25 OR 0.5 MG/DOSE,) 2 MG/1.5ML SOPN; Inject 0.25 mg into the skin once a week.  Dispense: 1.5 mL; Refill:  0  3. At risk for osteoporosis Ralph Small was given approximately 15 minutes of osteoporosis prevention counseling today. Ralph Small is at risk for osteopenia and osteoporosis due to his Vitamin D deficiency. He was encouraged to take his Vitamin D and follow his higher calcium diet and increase strengthening exercise to help strengthen his bones and decrease his risk of osteopenia and osteoporosis.  Repetitive spaced learning was employed today to elicit superior memory formation and behavioral change.  4. Obesity, current BMI 70 Ralph Small is currently in the action stage of change. As such, his goal is to continue with weight loss efforts. He has agreed to the Category 4 Plan.   Meal planning Exercise goals: Will continue Zoom and do more walking  Behavioral modification strategies: increasing lean protein intake, decreasing simple carbohydrates, increasing vegetables, increasing water intake, decreasing eating out, no skipping meals, meal planning and cooking strategies, keeping healthy foods in the home, ways to avoid boredom eating, ways to avoid night time snacking, better snacking choices, emotional eating strategies and planning for success.  Ralph Small has agreed to follow-up with our clinic in 2 weeks. He was informed of the importance of frequent follow-up visits to maximize his success with intensive lifestyle modifications for his multiple health conditions.   Objective:   Blood pressure 140/82, pulse 75, temperature 98.1 F (36.7 C), height 6\' 1"  (1.854 m), weight (!) 532 lb (241.3 kg), SpO2 97 %. Body mass index is 70.19 kg/m.  General: Cooperative, alert,  well developed, in no acute distress. HEENT: Conjunctivae and lids unremarkable. Cardiovascular: Regular rhythm.  Lungs: Normal work of breathing. Neurologic: No focal deficits.   Lab Results  Component Value Date   CREATININE 0.95 04/01/2020   BUN 15 04/01/2020   NA 138 04/01/2020   K 4.2 04/01/2020   CL 102 04/01/2020   CO2 28  04/01/2020   Lab Results  Component Value Date   ALT 48 (H) 04/01/2020   AST 30 04/01/2020   BILITOT 0.4 04/01/2020   Lab Results  Component Value Date   HGBA1C 6.5 (H) 04/01/2020   HGBA1C 5.7 (H) 06/16/2019   Lab Results  Component Value Date   INSULIN 53.8 (H) 04/27/2020   Lab Results  Component Value Date   TSH 2.86 04/01/2020   Lab Results  Component Value Date   CHOL 199 04/01/2020   HDL 43 04/01/2020   LDLCALC 136 (H) 04/01/2020   TRIG 98 04/01/2020   CHOLHDL 4.6 04/01/2020   Lab Results  Component Value Date   WBC 9.4 04/01/2020   HGB 14.5 04/01/2020   HCT 43.0 04/01/2020   MCV 90.7 04/01/2020   PLT 257 04/01/2020   No results found for: IRON, TIBC, FERRITIN   Attestation Statements:   Reviewed by clinician on day of visit: allergies, medications, problem list, medical history, surgical history, family history, social history, and previous encounter notes.  Edmund Hilda, CMA, am acting as Energy manager for [employer], DO.  I have reviewed the above documentation for accuracy and completeness, and I agree with the above. Corinna Capra, DO

## 2020-07-08 ENCOUNTER — Encounter (INDEPENDENT_AMBULATORY_CARE_PROVIDER_SITE_OTHER): Payer: Self-pay | Admitting: Bariatrics

## 2020-07-09 ENCOUNTER — Other Ambulatory Visit: Payer: Self-pay

## 2020-07-09 ENCOUNTER — Ambulatory Visit: Payer: Managed Care, Other (non HMO) | Admitting: Family Medicine

## 2020-07-09 ENCOUNTER — Encounter: Payer: Self-pay | Admitting: Family Medicine

## 2020-07-09 ENCOUNTER — Ambulatory Visit (INDEPENDENT_AMBULATORY_CARE_PROVIDER_SITE_OTHER): Payer: Managed Care, Other (non HMO)

## 2020-07-09 VITALS — BP 169/69 | HR 80 | Ht 73.0 in | Wt >= 6400 oz

## 2020-07-09 DIAGNOSIS — M25562 Pain in left knee: Secondary | ICD-10-CM | POA: Diagnosis not present

## 2020-07-09 MED ORDER — MELOXICAM 15 MG PO TABS: 15.0000 mg | ORAL_TABLET | Freq: Every day | ORAL | 0 refills | Status: AC

## 2020-07-09 NOTE — Patient Instructions (Signed)
Have xray completed.  Try meloxicam.  If not improving we may need to have you see Dr. Karie Schwalbe to consider injection.

## 2020-07-12 DIAGNOSIS — M25562 Pain in left knee: Secondary | ICD-10-CM | POA: Insufficient documentation

## 2020-07-12 NOTE — Assessment & Plan Note (Signed)
Discussed recommendations for weight loss.  HE is seeing healthy weight and wellness.  Xrays ordered.  Adding meloxicam as needed.  We discussed having him see Dr. Benjamin Stain for injection as well if not improving.

## 2020-07-12 NOTE — Progress Notes (Signed)
Ralph Small - 45 y.o. male MRN [id number]  Date of birth: Jun 22, 1975  Subjective Chief Complaint  Patient presents with  . Knee Injury    HPI Ralph Small is a 45 y.o. male here today with complaint of L knee pain.  He denies any known injury to the knee.  He isn't sure if there is swelling but the knee feels tight.  Pain is worse with weight bearing.  No instability of the knee.    ROS:  A comprehensive ROS was completed and negative except as noted per HPI  No Known Allergies  Past Medical History:  Diagnosis Date  . Chlamydia   . Hypertension   . Morbid obesity (HCC)   . Prediabetes     Past Surgical History:  Procedure Laterality Date  . HERNIA REPAIR      Social History   Socioeconomic History  . Marital status: Married    Spouse name: Marylene Land  . Number of children: Not on file  . Years of education: Not on file  . Highest education level: Not on file  Occupational History  . Occupation: Coordinator    Comment: [employer]  Tobacco Use  . Smoking status: Former Smoker    Packs/day: 0.50    Years: 15.00    Pack years: 7.50    Types: Cigarettes    Quit date: 10/13/2009    Years since quitting: 10.7  . Smokeless tobacco: Never Used  Vaping Use  . Vaping Use: Every day  Substance and Sexual Activity  . Alcohol use: Yes    Comment: Rarely  . Drug use: Never  . Sexual activity: Not Currently    Partners: Female    Birth control/protection: Abstinence  Other Topics Concern  . Not on file  Social History Narrative  . Not on file   Social Determinants of Health   Financial Resource Strain: Not on file  Food Insecurity: Not on file  Transportation Needs: Not on file  Physical Activity: Not on file  Stress: Not on file  Social Connections: Not on file    Family History  Problem Relation Age of Onset  . Diabetes Mother   . Hypertension Mother   . High Cholesterol Mother   . Heart disease Mother   . Obesity Mother   . Heart attack  Father   . High blood pressure Father   . High Cholesterol Father   . Heart disease Father   . Breast cancer Maternal Aunt     Health Maintenance  Topic Date Due  . COVID-19 Vaccine (1) Never done  . TETANUS/TDAP  Never done  . COLONOSCOPY (Pts 45-33yrs Insurance coverage will need to be confirmed)  Never done  . Hepatitis C Screening  04/01/2021 (Originally 07/17/1992)  . HIV Screening  04/01/2021 (Originally 07/17/1989)  . INFLUENZA VACCINE  09/13/2020  . Zoster Vaccines- Shingrix (1 of 2) 07/17/2024  . HPV VACCINES  Aged Out     ----------------------------------------------------------------------------------------------------------------------------------------------------------------------------------------------------------------- Physical Exam BP (!) 169/69 (BP Location: Left Wrist, Patient Position: Sitting, Cuff Size: Large)   Pulse 80   Ht 6\' 1"  (1.854 m)   Wt (!) 538 lb 1.9 oz (244.1 kg)   SpO2 97%   BMI 71.00 kg/m   Physical Exam Musculoskeletal:     Comments: L knee without noticeable effusion.  ROM is normal but some pain with full flexion/extension.   Mild ttp with compression of patella.  No laxity or pain with valgus/varus stress.  Negative lachman.   Neurological:  Mental Status: He is alert.     ------------------------------------------------------------------------------------------------------------------------------------------------------------------------------------------------------------------- Assessment and Plan  Acute pain of left knee Discussed recommendations for weight loss.  HE is seeing healthy weight and wellness.  Xrays ordered.  Adding meloxicam as needed.  We discussed having him see Dr. Benjamin Stain for injection as well if not improving.    Meds ordered this encounter  Medications  . meloxicam (MOBIC) 15 MG tablet    Sig: Take 1 tablet (15 mg total) by mouth daily.    Dispense:  30 tablet    Refill:  0    No follow-ups  on file.    This visit occurred during the SARS-CoV-2 public health emergency.  Safety protocols were in place, including screening questions prior to the visit, additional usage of staff PPE, and extensive cleaning of exam room while observing appropriate contact time as indicated for disinfecting solutions.

## 2020-07-14 ENCOUNTER — Telehealth: Payer: Self-pay | Admitting: Neurology

## 2020-07-14 DIAGNOSIS — R0601 Orthopnea: Secondary | ICD-10-CM | POA: Insufficient documentation

## 2020-07-14 DIAGNOSIS — F519 Sleep disorder not due to a substance or known physiological condition, unspecified: Secondary | ICD-10-CM | POA: Insufficient documentation

## 2020-07-14 DIAGNOSIS — Z6841 Body Mass Index (BMI) 40.0 and over, adult: Secondary | ICD-10-CM | POA: Insufficient documentation

## 2020-07-14 DIAGNOSIS — E662 Morbid (severe) obesity with alveolar hypoventilation: Secondary | ICD-10-CM | POA: Insufficient documentation

## 2020-07-14 NOTE — Progress Notes (Signed)
IMPRESSION: This HST indicates the presence of severe OSA (obstructive sleep apnea) but failed to record hypoxemia. There is exacerbation of apnea in REM sleep, but no REM sleep dependency.   Calculated pAHI (per hour): 38.3                    REM pAHI: 60.6    RECOMMENDATION: This degree of sleep apnea in a patient with super-obesity requires positive airway pressure therapy. I have to assume that hypoventilation is present. The amount of pressure required to overcome apnea- hypoventilation can be uncomfortable - I would prefer to invite this patient for an attended titration so CPAP or BiPAP can be tried and oxygen can be added as needed.   His insurance is [employer], which leaves [place] with usually with no in- lab options- Plan B will be auto CPAP, 5-10 cm water, 3 cm EPR and mask of patients choice .

## 2020-07-14 NOTE — Telephone Encounter (Signed)
-----   Message from Melvyn Novas, MD sent at 07/14/2020  1:01 PM EDT ----- IMPRESSION: This HST indicates the presence of severe OSA (obstructive sleep apnea) but failed to record hypoxemia. There is exacerbation of apnea in REM sleep, but no REM sleep dependency.   Calculated pAHI (per hour): 38.3                    REM pAHI: 60.6    RECOMMENDATION: This degree of sleep apnea in a patient with super-obesity requires positive airway pressure therapy. I have to assume that hypoventilation is present. The amount of pressure required to overcome apnea- hypoventilation can be uncomfortable - I would prefer to invite this patient for an attended titration so CPAP or BiPAP can be tried and oxygen can be added as needed.   His insurance is [employer], which leaves [place] with usually with no in- lab options- Plan B will be auto CPAP, 5-10 cm water, 3 cm EPR and mask of patients choice .

## 2020-07-14 NOTE — Addendum Note (Signed)
Addended by: Melvyn Novas on: 07/14/2020 01:01 PM   Modules accepted: Orders

## 2020-07-14 NOTE — Procedures (Signed)
HOME SLEEP TEST (Watch PAT)  STUDY DATA LOAD: 06/28/20  DOB: 07/03/1975  MRN: [id number]  ORDERING CLINICIAN: Melvyn Novas, MD   REFERRING CLINICIAN: Everrett Coombe, DO   CLINICAL INFORMATION/HISTORY: Ralph Small  has a medical history of super-obesity, orthopnea, morning headaches, vascular headaches,  Hypertension, and Prediabetes/ Superobesity with left ankle arthritis. He reports severe daytime sleepiness.   Epworth sleepiness score: 15/24. BMI: 70.32 kg/m Neck Circumference: 23"  FINDINGS:   Total Record Time (hours, min): 8 h 0 min Total Sleep Time (hours, min):  6 h 44 min   Percent REM (%):    18.56 %   Calculated pAHI (per hour): 38.3       REM pAHI: 60.6  NREM pAHI: 33.3 Supine AHI: N/A Oxygen Saturation (%) Mean: 91  Minimum oxygen saturation (%):      67    O2 Saturation Range (%): 67-98  O2Saturation (minutes) <=88%: No data (!)  Pulse Mean (bpm):    71  Pulse Range (48-104)   IMPRESSION: This HST indicates the presence of severe OSA (obstructive sleep apnea) but failed to record hypoxemia. There is exacerbation of apnea in REM sleep, but no REM sleep dependency.     RECOMMENDATION: This degree of sleep apnea in a patient with super-obesity requires positive airway pressure therapy. I have to assume that hypoventilation is present. The amount of pressure required to overcome apnea- hypoventilation can be uncomfortable - I would prefer to invite this patient for an attended titration so CPAP or BiPAP can be tried and oxygen can be added as needed.   His insurance is [employer], which leaves [place] with usually with no in- lab options- Plan B will be auto CPAP, 5-10 cm water, 3 cm EPR and mask of patients choice .     INTERPRETING PHYSICIAN:  Melvyn Novas, MD    [facility] and [facility] Sleep Board certified by The [employer] of Sleep Medicine and Diplomate of the [employer] of Sleep Medicine. Board certified In Neurology through  the ABPN, Fellow of the [employer] of Neurology. Medical Director of [facility].   Sleep Summary  Oxygen Saturation Statistics   Start Study Time: End Study Time: Total Recording Time:          12:20:05 AM 8:20:06 AM 8 h, 0 min  Total Sleep Time % REM of Sleep Time:  6 h, 44 min  18.6    Mean: 91 Minimum: 67 Maximum: 98  Mean of Desaturations Nadirs (%):   84  Oxygen Desatur. %:  4-9 10-20 >20 Total  Events Number Total   126  52 69.6 28.7  3 1.7  181 100.0  Oxygen Saturation: <90 <=88 <85 <80 <70  Duration (minutes): Sleep % 93.2 23.1 81.9 50.8 20.3 12.6 20.0 5.0 0.5 0.1     Respiratory Indices      Total Events REM NREM All Night  pRDI: pAHI 3%:  259  248 63.1 60.6 34.8 33.3 40.0 38.3  ODI 4%: pAHI 4%:  181 192 54.7 22.0 28.0 29.7       Pulse Rate Statistics during Sleep (BPM)      Mean: 71 Minimum: 48 Maximum: 104

## 2020-07-14 NOTE — Telephone Encounter (Signed)
I called pt. I advised pt that Dr. Vickey Huger reviewed their sleep study results and found that pt has severe OSA. Dr. Vickey Huger recommends that pt starts auto CPAP. I reviewed PAP compliance expectations with the pt. Pt is agreeable to starting a CPAP. I advised pt that an order will be sent to a DME, [facility] and [facility] will call the pt within about one week after they file with the pt's insurance. [facility]) will show the pt how to use the machine, fit for masks, and troubleshoot the CPAP if needed. A follow up appt will need to be made for insurance purposes with Dr. Vickey Huger or NP. Pt verbalized understanding to arrive 15 minutes early and bring their CPAP. A letter with all of this information in it will be mailed to the pt as a reminder. I verified with the pt that the address we have on file is correct. Pt verbalized understanding of results. Pt had no questions at this time but was encouraged to call back if questions arise. I have sent the order to [facility])  and have received confirmation that they have received the order.

## 2020-07-27 ENCOUNTER — Other Ambulatory Visit: Payer: Self-pay | Admitting: Neurology

## 2020-07-27 ENCOUNTER — Ambulatory Visit (INDEPENDENT_AMBULATORY_CARE_PROVIDER_SITE_OTHER): Payer: Managed Care, Other (non HMO) | Admitting: Bariatrics

## 2020-07-27 ENCOUNTER — Encounter (INDEPENDENT_AMBULATORY_CARE_PROVIDER_SITE_OTHER): Payer: Self-pay | Admitting: Bariatrics

## 2020-07-27 ENCOUNTER — Other Ambulatory Visit: Payer: Self-pay

## 2020-07-27 VITALS — BP 145/83 | HR 91 | Temp 98.1°F | Ht 73.0 in | Wt >= 6400 oz

## 2020-07-27 DIAGNOSIS — Z6841 Body Mass Index (BMI) 40.0 and over, adult: Secondary | ICD-10-CM | POA: Diagnosis not present

## 2020-07-27 DIAGNOSIS — Z9189 Other specified personal risk factors, not elsewhere classified: Secondary | ICD-10-CM

## 2020-07-27 DIAGNOSIS — E669 Obesity, unspecified: Secondary | ICD-10-CM

## 2020-07-27 DIAGNOSIS — G4733 Obstructive sleep apnea (adult) (pediatric): Secondary | ICD-10-CM

## 2020-07-27 DIAGNOSIS — R7303 Prediabetes: Secondary | ICD-10-CM

## 2020-07-27 DIAGNOSIS — E559 Vitamin D deficiency, unspecified: Secondary | ICD-10-CM

## 2020-07-27 DIAGNOSIS — F519 Sleep disorder not due to a substance or known physiological condition, unspecified: Secondary | ICD-10-CM

## 2020-07-27 MED ORDER — OZEMPIC (0.25 OR 0.5 MG/DOSE) 2 MG/1.5ML ~~LOC~~ SOPN: 0.2500 mg | PEN_INJECTOR | SUBCUTANEOUS | 0 refills | Status: DC

## 2020-07-27 MED ORDER — VITAMIN D (ERGOCALCIFEROL) 1.25 MG (50000 UNIT) PO CAPS: 50000.0000 [IU] | ORAL_CAPSULE | ORAL | 0 refills | Status: DC

## 2020-08-03 NOTE — Progress Notes (Signed)
Chief Complaint:   OBESITY Ralph Small is here to discuss his progress with his obesity treatment plan along with follow-up of his obesity related diagnoses. Ralph Small is on the Category 4 Plan and states he is following his eating plan approximately 50% of the time. [name] states he is doing water HIIT, yoga, aerobics, and cardio for 45+ minutes minutes 3 times per week.  Today's visit was #: 3 Starting weight: 526 lbs Starting date: 04/27/2020 Today's weight: 529 lbs Today's date: 07/27/2020 Total lbs lost to date: 0 Total lbs lost since last in-office visit: 3 lbs  Interim History: [name] is down another 3 pounds since his last visit.  He is doing well with his water.  Subjective:   1. Prediabetes Ralph Small has a diagnosis of prediabetes based on his elevated HgA1c and was informed this puts him at greater risk of developing diabetes. He continues to work on diet and exercise to decrease his risk of diabetes. He denies nausea or hypoglycemia.  He is taking metformin and Ozempic.  He cut down on bread.  Lab Results  Component Value Date   HGBA1C 6.5 (H) 04/01/2020   Lab Results  Component Value Date   INSULIN 53.8 (H) 04/27/2020   2. Vitamin D deficiency Ralph Small's Vitamin D level was 13.5 on 04/27/2020. He is currently taking prescription vitamin D 50,000 IU each week. He denies nausea, vomiting or muscle weakness.  3. At risk for hypoglycemia Ralph Small is at increased risk for hypoglycemia due to changes in diet and diagnosis of prediabetes.    Assessment/Plan:   1. Prediabetes [name] will continue to work on weight loss, exercise, and decreasing simple carbohydrates to help decrease the risk of diabetes. Will refill Ozempic today, as per below.  Continue metformin.  - Refill Semaglutide,0.25 or 0.5MG /DOS, (OZEMPIC, 0.25 OR 0.5 MG/DOSE,) 2 MG/1.5ML SOPN; Inject 0.25 mg into the skin once a week.  Dispense: 1.5 mL; Refill: 0  2. Vitamin D deficiency Low Vitamin D level contributes to  fatigue and are associated with obesity, breast, and colon cancer. He agrees to continue to take prescription Vitamin D @50 ,000 IU every week and will follow-up for routine testing of Vitamin D, at least 2-3 times per year to avoid over-replacement.  - Refill Vitamin D, Ergocalciferol, (DRISDOL) 1.25 MG (50000 UNIT) CAPS capsule; Take 1 capsule (50,000 Units total) by mouth every 7 (seven) days.  Dispense: 4 capsule; Refill: 0  3. At risk for hypoglycemia Ralph Small was given approximately 15 minutes of counseling today regarding prevention of hypoglycemia. He was advised of symptoms of hypoglycemia. Ralph Small was instructed to avoid skipping meals, eat regular protein rich meals and schedule low calorie snacks as needed.   Repetitive spaced learning was employed today to elicit superior memory formation and behavioral change   4. Obesity, current BMI 50  Ralph Small is currently in the action stage of change. As such, his goal is to continue with weight loss efforts. He has agreed to the Category 4 Plan.   Exercise goals:  Working out more (water exercises).  Behavioral modification strategies: increasing lean protein intake, decreasing simple carbohydrates, increasing vegetables, increasing water intake, decreasing eating out, no skipping meals, meal planning and cooking strategies, keeping healthy foods in the home, and planning for success.  Ralph Small has agreed to follow-up with our clinic in 2 weeks. He was informed of the importance of frequent follow-up visits to maximize his success with intensive lifestyle modifications for his multiple health conditions.   Objective:  Pulse 91, temperature 98.1 F (36.7 C), height 6\' 1"  (1.854 m), weight (!) 529 lb (240 kg), SpO2 95 %. Body mass index is 69.79 kg/m.  General: Cooperative, alert, well developed, in no acute distress. HEENT: Conjunctivae and lids unremarkable. Cardiovascular: Regular rhythm.  Lungs: Normal work of breathing. Neurologic: No focal  deficits.   Lab Results  Component Value Date   CREATININE 0.95 04/01/2020   BUN 15 04/01/2020   NA 138 04/01/2020   K 4.2 04/01/2020   CL 102 04/01/2020   CO2 28 04/01/2020   Lab Results  Component Value Date   ALT 48 (H) 04/01/2020   AST 30 04/01/2020   BILITOT 0.4 04/01/2020   Lab Results  Component Value Date   HGBA1C 6.5 (H) 04/01/2020   HGBA1C 5.7 (H) 06/16/2019   Lab Results  Component Value Date   INSULIN 53.8 (H) 04/27/2020   Lab Results  Component Value Date   TSH 2.86 04/01/2020   Lab Results  Component Value Date   CHOL 199 04/01/2020   HDL 43 04/01/2020   LDLCALC 136 (H) 04/01/2020   TRIG 98 04/01/2020   CHOLHDL 4.6 04/01/2020   Lab Results  Component Value Date   WBC 9.4 04/01/2020   HGB 14.5 04/01/2020   HCT 43.0 04/01/2020   MCV 90.7 04/01/2020   PLT 257 04/01/2020   Attestation Statements:   Reviewed by clinician on day of visit: allergies, medications, problem list, medical history, surgical history, family history, social history, and previous encounter notes.  I, 04/03/2020, CMA, am acting as Insurance claims handler for Energy manager, DO  I have reviewed the above documentation for accuracy and completeness, and I agree with the above. [employer], DO

## 2020-08-04 ENCOUNTER — Encounter: Payer: Self-pay | Admitting: Neurology

## 2020-08-05 ENCOUNTER — Encounter (INDEPENDENT_AMBULATORY_CARE_PROVIDER_SITE_OTHER): Payer: Self-pay | Admitting: Bariatrics

## 2020-09-03 ENCOUNTER — Encounter: Payer: Self-pay | Admitting: Neurology

## 2020-09-14 ENCOUNTER — Telehealth: Payer: Self-pay

## 2020-09-14 NOTE — Telephone Encounter (Signed)
Called and lvm for pt to bring cpap machine and power cord for a manual data download.   Has appt 09/15/20 w Dr. Vickey Huger

## 2020-09-15 ENCOUNTER — Encounter: Payer: Self-pay | Admitting: Neurology

## 2020-09-15 ENCOUNTER — Ambulatory Visit: Payer: Managed Care, Other (non HMO) | Admitting: Neurology

## 2020-09-15 VITALS — BP 148/80 | HR 77 | Ht 73.0 in | Wt >= 6400 oz

## 2020-09-15 DIAGNOSIS — F519 Sleep disorder not due to a substance or known physiological condition, unspecified: Secondary | ICD-10-CM | POA: Diagnosis not present

## 2020-09-15 DIAGNOSIS — E662 Morbid (severe) obesity with alveolar hypoventilation: Secondary | ICD-10-CM

## 2020-09-15 DIAGNOSIS — E669 Obesity, unspecified: Secondary | ICD-10-CM | POA: Diagnosis not present

## 2020-09-15 DIAGNOSIS — R0601 Orthopnea: Secondary | ICD-10-CM

## 2020-09-15 DIAGNOSIS — G4734 Idiopathic sleep related nonobstructive alveolar hypoventilation: Secondary | ICD-10-CM

## 2020-09-15 DIAGNOSIS — Z789 Other specified health status: Secondary | ICD-10-CM

## 2020-09-15 DIAGNOSIS — G4733 Obstructive sleep apnea (adult) (pediatric): Secondary | ICD-10-CM | POA: Diagnosis not present

## 2020-09-15 DIAGNOSIS — Z6841 Body Mass Index (BMI) 40.0 and over, adult: Secondary | ICD-10-CM

## 2020-09-15 NOTE — Progress Notes (Signed)
SLEEP MEDICINE CLINIC    Provider:  Melvyn Novasarmen  Amiri Tritch, MD  Primary Care Physician:  Ralph Small [address]  [address] [place] [id number]     Referring Provider: Everrett CoombeMatthews, [place], Small [address]  [address] [place] [id number]          Chief Complaint according to patient   Patient presents with:     New Patient (Initial Visit)     Pt alone, rm 11. Pt states that he is on the weight loss journey and his wife has informed him that he has apnea events. Wakes up 2-3 times a night to void. HST -June 1 documented an AHI of 38.3 and during REM sleep the patient's apnea index exacerbated to 60.6/h which is literally 1 apnea per minute.  The minimum oxygen and saturation was 67% this is severe his heart rate varied between 48 and 104 bpm.  So my concern was that this patient needs probably BiPAP and not just CPAP given his body mass index and severity of hypoventilation with Ralph Auer[employer]igna does not allow us an in lab titration so we have to start with an auto titrating CPAP.  It is clear that the patient does not get enough air he cannot use the CPAP.        HISTORY OF PRESENT ILLNESS:    Interval history : 09-15-2020-    HST from June 2022:  Epworth sleepiness score: 15/24. BMI: 70.32 kg/m Neck Circumference: 23"   FINDINGS: Total Record Time (hours, min):         8 h 0 min Total Sleep Time (hours, min):           6 h 44 min        Percent REM (%):                               18.56 %            Calculated pAHI (per hour): 38.3                    REM pAHI: 60.6          NREM pAHI: 33.3       Supine AHI: N/A Oxygen Saturation (%) Mean:            91                    Minimum oxygen saturation (%):      67        O2 Saturation Range (%): 67-98                    O2Saturation (minutes) <=88%:         No data (!) Pulse Mean (bpm):                 71                    Pulse Range (48-104)   IMPRESSION: This HST indicates the presence of severe  OSA (obstructive sleep apnea) but failed to record hypoxemia. There is exacerbation of apnea in REM sleep, but no REM sleep dependency.       RECOMMENDATION: This degree of sleep apnea in a patient with super-obesity requires positive airway pressure therapy. I have to assume that hypoventilation is present. The amount of  pressure required to overcome apnea- hypoventilation can be uncomfortable - I would prefer to invite this patient for an attended titration so CPAP or BiPAP can be tried and oxygen can be added as needed.   His insurance is [employer], which leaves [place] with usually with no in- lab options- Plan B will be auto CPAP, 5-20 cm water, 3 cm EPR and mask of patients choice .  ST -June 1 documented an AHI of 38.3 and during REM sleep the patient's apnea index exacerbated to 60.6/h which is literally 1 apnea per minute.  The minimum oxygen and saturation was 67% this is severe his heart rate varied between 48 and 104 bpm.  So my concern was that this patient needs probably BiPAP and not just CPAP given his body mass index and severity of hypoventilation with Ralph Small does not allow [place] an in lab titration so we have to start with an auto titrating CPAP.  It is clear that the patient does not get enough air he cannot use the CPAP.  He will return for an attended titration. He likely needs BiPAP.       Initial consultation:  Ralph Small is a 45 - year old  Caucasian male patient seen here upon referral bu Dr Ralph Coombe on 09/15/2020  for a Sleep Consultation.   Chief concern according to patient :  Pt alone, rm 11. Pt states that he is on the weight loss journey and his wife has informed him that he has apnea events. Wakes up 2-3 times a night to void. Only avg 5-6 hrs of sleep (broken). Never had a SS. Fatigue and tired all the time.  Ralph Small  has a medical history of super-obesity, orthopnea, morning headaches, vascular headaches,  Hypertension,  and Prediabetes.  Superobesity  ,left  ankle arthritis,     Sleep relevant medical history: Nocturia 3, no Tonsillectomy,    Family medical /sleep history: No other family member on CPAP with OSA, insomnia, sleep walkers.    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 Family status is married  Without children, 2 dogs and one cat. He is still grieving, his mother died in 2022-12-28. He found her on her kitchen floor.  The patient currently works from home since 04/2018.  He works 8 AM to 8 PM.  Tobacco use: 2011.  ETOH use , Caffeine intake in form of Coffee(2-3 cups in AM ). Regular exercise in form of  Walking.     Sleep habits are as follows: The patient's dinner time is between 8-9 PM. The patient goes to bed at 10.30 PM and continues to sleep for intervals of 1-2  hours, wakes for 3 bathroom breaks, the first time at 2 AM.   The preferred sleep position is elevated , supine with the support of several  pillows. " If I sleep on my side my arms go to sleep"  I have GERD-  I cant sleep flat, palpitations. Waking choking".  Dreams are reportedly frequent/vivid- some nightmares.  7 AM is the usual rise time.The patient wakes up with an alarm.  He  reports not feeling refreshed or restored in AM, with symptoms such as dry mouth , some morning headaches, and residual fatigue. His BP was elevated.  Naps are taken infrequently, only at home, weekends, lasting from 15-45 minutes.    Review of Systems: Out of a complete 14 system review,  the patient complains of only the following symptoms, and all other reviewed systems are negative.:  Fatigue, sleepiness , snoring, fragmented sleep,nocturia, choking.   How likely are you to doze in the following situations: 0 = not likely, 1 = slight chance, 2 = moderate chance, 3 = high chance   Sitting and Reading? Watching Television? Sitting inactive in a public place (theater or meeting)? As a  passenger in a car for an hour without a break? Lying down in the afternoon when circumstances permit? Sitting and talking to someone? Sitting quietly after lunch without alcohol? In a car, while stopped for a few minutes in traffic?   Total = 15/ 24 points - 6 from 15  on current CPAP auto use.   FSS endorsed at 49/ 63 points.   Social History   Socioeconomic History   Marital status: Married    Spouse name: Marylene Land   Number of children: Not on file   Years of education: Not on file   Highest education level: Not on file  Occupational History   Occupation: Coordinator    Comment: [employer]  Tobacco Use   Smoking status: Former    Packs/day: 0.50    Years: 15.00    Pack years: 7.50    Types: Cigarettes    Quit date: 10/13/2009    Years since quitting: 10.9   Smokeless tobacco: Never  Vaping Use   Vaping Use: Every day  Substance and Sexual Activity   Alcohol use: Yes    Comment: Rarely   Drug use: Never   Sexual activity: Not Currently    Partners: Female    Birth control/protection: Abstinence  Other Topics Concern   Not on file  Social History Narrative   Not on file   Social Determinants of Health   Financial Resource Strain: Not on file  Food Insecurity: Not on file  Transportation Needs: Not on file  Physical Activity: Not on file  Stress: Not on file  Social Connections: Not on file    Family History  Problem Relation Age of Onset   Diabetes Mother    Hypertension Mother    High Cholesterol Mother    Heart disease Mother    Obesity Mother    Heart attack Father    High blood pressure Father    High Cholesterol Father    Heart disease Father    Breast cancer Maternal Aunt     Past Medical History:  Diagnosis Date   Chlamydia    Hypertension    Morbid obesity (HCC)    Prediabetes     Past Surgical History:  Procedure Laterality Date   HERNIA REPAIR       Current Outpatient Medications on File Prior to Visit  Medication Sig Dispense  Refill   losartan (COZAAR) 50 MG tablet Take 1 tablet (50 mg total) by mouth daily. 90 tablet 3   meloxicam (MOBIC) 15 MG tablet Take 1 tablet (15 mg total) by mouth daily. 30 tablet 0   metFORMIN (GLUCOPHAGE XR) 500 MG 24 hr tablet Take 1 tablet (500 mg total) by mouth daily with breakfast. 90 tablet 1   Vitamin D, Ergocalciferol, (DRISDOL) 1.25 MG (50000 UNIT) CAPS capsule Take 1 capsule (50,000 Units total) by mouth every 7 (seven) days. 4 capsule 0   No current facility-administered medications on file prior to visit.    Physical exam:  Today's Vitals   09/15/20 1134  BP: (!) 148/80  Pulse: 77  Weight: (!) 533 lb  8 oz (242 kg)  Height: 6\' 1"  (1.854 m)   Body mass index is 70.39 kg/m.   Wt Readings from Last 3 Encounters:  09/15/20 (!) 533 lb 8 oz (242 kg)  07/27/20 (!) 529 lb (240 kg)  07/09/20 (!) 538 lb 1.9 oz (244.1 kg)     Ht Readings from Last 3 Encounters:  09/15/20 6\' 1"  (1.854 m)  07/27/20 6\' 1"  (1.854 m)  07/09/20 6\' 1"  (1.854 m)      General: The patient is awake, alert and appears not in acute distress. The patient is well groomed. Head: Normocephalic, atraumatic. Neck is supple. Mallampati narrow and small- 2,  neck circumference:23 inches.  Nasal airflow patent.   Retrognathia is not seen.  Dental status: intact.  Cardiovascular:  Regular rate and cardiac rhythm by pulse,  without distended neck veins. Respiratory: Lungs are clear to auscultation.  Skin: evidence of ankle edema, or rash. Trunk: The patient's posture is erect.   Neurologic exam : The patient is awake and alert, oriented to place and time.   Memory subjective described as intact.  Attention span & concentration ability appears normal.  Speech is fluent,  without  dysarthria, dysphonia or aphasia.  Mood and affect are appropriate.   Cranial nerves: no loss of smell or taste reported -  Pupils are equal and briskly reactive to light. Funduscopic exam deferred.   Extraocular movements  in vertical and horizontal planes were intact and without nystagmus. No Diplopia.  Facial motor strength is symmetric and tongue and uvula move midline.  Neck ROM : rotation, tilt and flexion extension were normal for age and shoulder shrug was symmetrical.        After spending a total time of  20  minutes face to face and additional time for physical and neurologic examination, review of laboratory studies,  personal review of imaging studies, reports and results of other testing and review of referral information / records as far as provided in visit, I have established the following assessments:  1) Superobesity - followed by Dr 07/29/20. He is now on metformin. He has hypoventilation with severe apnea, nadir 02 in the 60s.  cleary orthopnea is being present, and choking, snoring, having daytime sleepiness - And now not tolerating auto CPAP.   My Plan is to proceed with:  1) I ordered an attended PAP titration , CPAP to BiPAP, if needed oxygen.  In the interval I will change CPAP to 8-20 cm water, 3 cm EPR.   I would like to thank , Small and 07/11/20, Small [address],  Ralph Coombe [place] for allowing me to meet with and to take care of this pleasant patient.   I plan to follow up either personally or through our NP within 2-4 month.     Electronically signed by: [place], MD 09/15/2020 12:09 PM  [facility] and [place] Board certified by The [id number] of Sleep Medicine and Diplomate of the Ralph Novas of Sleep Medicine. Board certified In Neurology through the ABPN, Fellow of the 11/15/2020 of Neurology. Medical Director of [facility].

## 2020-09-16 NOTE — Telephone Encounter (Signed)
Faxed order to [facility] as requested by pt, received fax confirmation.

## 2020-09-29 NOTE — Telephone Encounter (Signed)
We like to see you with your CPAOP and mask and all cables- here in the office. [facility] , Dr Jaydynn Wolford, - PLEASE DONT RETURN YOUR CPAP YET!! If this is not possible for you, we ask you to speak to DME and try a different mask.

## 2020-10-04 ENCOUNTER — Ambulatory Visit: Payer: Managed Care, Other (non HMO) | Admitting: Neurology

## 2020-10-21 ENCOUNTER — Telehealth: Payer: Self-pay | Admitting: Neurology

## 2020-10-21 NOTE — Telephone Encounter (Signed)
Spoke with patient to schedule CPAP Titration and pt asked to cancel due to him no longer having the CPAP machine. Stated he turned in the CPAP machine awhile back, not long after meeting with Dr. Vickey Huger

## 2020-11-02 ENCOUNTER — Other Ambulatory Visit: Payer: Self-pay | Admitting: Family Medicine

## 2020-11-02 NOTE — Telephone Encounter (Signed)
Called x1. Mailbox was full. Will attempt again later. AM

## 2020-11-02 NOTE — Telephone Encounter (Signed)
Please call pt to schedule appt.  No further refills until pt is seen.  T. Lalaine Overstreet, CMA  

## 2021-01-27 ENCOUNTER — Encounter: Payer: Self-pay | Admitting: Family Medicine

## 2021-01-27 ENCOUNTER — Encounter (INDEPENDENT_AMBULATORY_CARE_PROVIDER_SITE_OTHER): Payer: Self-pay | Admitting: Bariatrics

## 2021-03-04 ENCOUNTER — Other Ambulatory Visit: Payer: Self-pay | Admitting: Family Medicine

## 2021-03-04 DIAGNOSIS — E119 Type 2 diabetes mellitus without complications: Secondary | ICD-10-CM

## 2021-03-04 DIAGNOSIS — I10 Essential (primary) hypertension: Secondary | ICD-10-CM

## 2021-03-10 LAB — CBC WITH DIFFERENTIAL/PLATELET
Absolute Monocytes: 583 cells/uL (ref 200–950)
Basophils Absolute: 55 cells/uL (ref 0–200)
Basophils Relative: 0.5 %
Eosinophils Absolute: 275 cells/uL (ref 15–500)
Eosinophils Relative: 2.5 %
HCT: 44.4 % (ref 38.5–50.0)
Hemoglobin: 14.9 g/dL (ref 13.2–17.1)
Lymphs Abs: 3509 cells/uL (ref 850–3900)
MCH: 30.5 pg (ref 27.0–33.0)
MCHC: 33.6 g/dL (ref 32.0–36.0)
MCV: 90.8 fL (ref 80.0–100.0)
MPV: 10.6 fL (ref 7.5–12.5)
Monocytes Relative: 5.3 %
Neutro Abs: 6578 cells/uL (ref 1500–7800)
Neutrophils Relative %: 59.8 %
Platelets: 298 10*3/uL (ref 140–400)
RBC: 4.89 10*6/uL (ref 4.20–5.80)
RDW: 12 % (ref 11.0–15.0)
Total Lymphocyte: 31.9 %
WBC: 11 10*3/uL — ABNORMAL HIGH (ref 3.8–10.8)

## 2021-03-10 LAB — LIPID PANEL W/REFLEX DIRECT LDL
Cholesterol: 197 mg/dL (ref <200)
HDL: 43 mg/dL (ref >=40)
LDL Cholesterol (Calc): 131 mg/dL (calc) — ABNORMAL HIGH
Non-HDL Cholesterol (Calc): 154 mg/dL (calc) — ABNORMAL HIGH (ref <130)
Total CHOL/HDL Ratio: 4.6 (calc) (ref <5.0)
Triglycerides: 118 mg/dL (ref <150)

## 2021-03-10 LAB — COMPLETE METABOLIC PANEL WITH GFR
AG Ratio: 1.2 (calc) (ref 1.0–2.5)
ALT: 32 U/L (ref 9–46)
AST: 22 U/L (ref 10–40)
Albumin: 4.2 g/dL (ref 3.6–5.1)
Alkaline phosphatase (APISO): 63 U/L (ref 36–130)
BUN: 17 mg/dL (ref 7–25)
CO2: 30 mmol/L (ref 20–32)
Calcium: 9.6 mg/dL (ref 8.6–10.3)
Chloride: 99 mmol/L (ref 98–110)
Creat: 0.94 mg/dL (ref 0.60–1.29)
Globulin: 3.4 g/dL (calc) (ref 1.9–3.7)
Glucose, Bld: 131 mg/dL — ABNORMAL HIGH (ref 65–99)
Potassium: 4.5 mmol/L (ref 3.5–5.3)
Sodium: 138 mmol/L (ref 135–146)
Total Bilirubin: 0.4 mg/dL (ref 0.2–1.2)
Total Protein: 7.6 g/dL (ref 6.1–8.1)
eGFR: 101 mL/min/{1.73_m2} (ref >=60)

## 2021-03-10 LAB — HEMOGLOBIN A1C
Hgb A1c MFr Bld: 6.3 % of total Hgb — ABNORMAL HIGH (ref <5.7)
Mean Plasma Glucose: 134 mg/dL
eAG (mmol/L): 7.4 mmol/L

## 2021-03-11 ENCOUNTER — Encounter: Payer: Self-pay | Admitting: Family Medicine

## 2021-03-14 MED ORDER — ATORVASTATIN CALCIUM 20 MG PO TABS: 20.0000 mg | ORAL_TABLET | Freq: Every day | ORAL | 3 refills | Status: AC

## 2021-03-14 MED ORDER — TIRZEPATIDE 7.5 MG/0.5ML ~~LOC~~ SOAJ: 7.5000 mg | SUBCUTANEOUS | 3 refills | Status: AC

## 2021-03-14 MED ORDER — TIRZEPATIDE 5 MG/0.5ML ~~LOC~~ SOAJ: 5.0000 mg | SUBCUTANEOUS | 0 refills | Status: AC

## 2021-03-14 MED ORDER — TIRZEPATIDE 2.5 MG/0.5ML ~~LOC~~ SOAJ: 2.5000 mg | SUBCUTANEOUS | 0 refills | Status: AC

## 2021-03-21 ENCOUNTER — Telehealth: Payer: Self-pay

## 2021-03-21 NOTE — Telephone Encounter (Addendum)
Initiated Prior authorization [id number] 5MG/0.5ML pen-injectors Via: Covermymeds Case/Key:[id number] - [place] Case ID: [id number] Status: Denied as of 03/21/21 Reason:MOUNJARO 2.5 MG/0.5 PEN INJCTR has a quantity limit of 11m (4 pens) once every 365 days.  CChristella Scheuermanncovers therapy in excess of this quantity limit requirement, in accordance with benefit plan  specifications, as medically necessary when both of the following criteria have been met: Notified Pt via: Mychart

## 2021-03-23 ENCOUNTER — Ambulatory Visit: Payer: Managed Care, Other (non HMO) | Admitting: Family Medicine

## 2021-04-05 NOTE — Telephone Encounter (Signed)
Please appeal.  Thanks!

## 2021-04-08 ENCOUNTER — Encounter: Payer: Self-pay | Admitting: Family Medicine

## 2021-04-08 ENCOUNTER — Telehealth: Payer: Self-pay

## 2021-04-08 NOTE — Telephone Encounter (Signed)
Initiated Appeal [id number] 5MG /0.5ML pen-injectors Via: [employer] Dept Case/Key:n/a Status: Pending as of 04/07/21 Reason:faxed documents Notified Pt via: Mychart

## 2021-04-13 NOTE — Telephone Encounter (Signed)
Have we gotten anything back on this appeal?

## 2021-04-14 ENCOUNTER — Ambulatory Visit (INDEPENDENT_AMBULATORY_CARE_PROVIDER_SITE_OTHER): Payer: Managed Care, Other (non HMO)

## 2021-04-14 ENCOUNTER — Other Ambulatory Visit: Payer: Self-pay

## 2021-04-14 ENCOUNTER — Ambulatory Visit: Payer: Managed Care, Other (non HMO) | Admitting: Family Medicine

## 2021-04-14 ENCOUNTER — Other Ambulatory Visit: Payer: Self-pay | Admitting: Family Medicine

## 2021-04-14 ENCOUNTER — Telehealth: Payer: Self-pay

## 2021-04-14 ENCOUNTER — Encounter: Payer: Self-pay | Admitting: Family Medicine

## 2021-04-14 VITALS — BP 131/89 | HR 87 | Ht 73.0 in | Wt >= 6400 oz

## 2021-04-14 DIAGNOSIS — I10 Essential (primary) hypertension: Secondary | ICD-10-CM | POA: Diagnosis not present

## 2021-04-14 DIAGNOSIS — R058 Other specified cough: Secondary | ICD-10-CM

## 2021-04-14 DIAGNOSIS — R059 Cough, unspecified: Secondary | ICD-10-CM

## 2021-04-14 DIAGNOSIS — F519 Sleep disorder not due to a substance or known physiological condition, unspecified: Secondary | ICD-10-CM | POA: Diagnosis not present

## 2021-04-14 MED ORDER — LOSARTAN POTASSIUM 50 MG PO TABS: 50.0000 mg | ORAL_TABLET | Freq: Every day | ORAL | 3 refills | Status: AC

## 2021-04-14 MED ORDER — PANTOPRAZOLE SODIUM 40 MG PO TBEC: 40.0000 mg | DELAYED_RELEASE_TABLET | Freq: Every day | ORAL | 3 refills | Status: AC

## 2021-04-14 MED ORDER — TIRZEPATIDE 10 MG/0.5ML ~~LOC~~ SOAJ: 10.0000 mg | SUBCUTANEOUS | 0 refills | Status: AC

## 2021-04-14 NOTE — Telephone Encounter (Signed)
Initiated Appeal for::Mounjaro 5MG/0.5ML pen-injectors ?Via: Christella Scheuermann ?WSFK/CLE:[id number] ?Status: denied as of 04/14/21 ?Reason:MOUNJARO 2.5 MG/0.5 PEN INJCTR has a quantity limit of 35m (4 pens) once every 365 days. Not medically necessary  ?CChristella Scheuermanncovers therapy in excess of this quantity limit requirement, in accordance with benefit plan  ?specifications, as medically necessary when both of the following criteria have been met: ?Notified Pt via: Mychart ?

## 2021-04-14 NOTE — Assessment & Plan Note (Signed)
Possible related to reflux and/or obesity hypoventilation syndrome.  Adding omeprazole.  Chest x-ray ordered. ?

## 2021-04-14 NOTE — Assessment & Plan Note (Signed)
Blood pressure stable at this time. °

## 2021-04-14 NOTE — Patient Instructions (Signed)
Contact Dr. Vickey Huger to try and get attended sleep study set up.  ?Start protonix daily.  ?Have chest xray completed.  ?Start mounjaro 7.5mg  weekly x4 weeks and then increase to 10mg  weekly.  ?

## 2021-04-14 NOTE — Assessment & Plan Note (Signed)
History of sleep apnea, unable to tolerate CPAP due to feeling like he was not getting enough air.  May need BiPAP.  We discussed consequences of untreated sleep apnea including heart disease and pulmonary hypertension.  Unclear if this may be contributing to his cough.  Recommend that he follow-up with neurology to discuss titration study. ?

## 2021-04-14 NOTE — Progress Notes (Signed)
?Ralph Small - 46 y.o. male MRN [id number]  Date of birth: [birth year] ? ?Subjective ?Chief Complaint  ?Patient presents with  ? Cough  ? Gastroesophageal Reflux  ? ? ?HPI ?Ralph Small is a 46 year old male here today with complaint of cough.  He has had cough is productive of clear to white sputum over the past several weeks.  He denies increased shortness of breath, chest pain or palpitations associated with this.  He does have history of severe sleep apnea.  He did try CPAP but was unable to tolerate this.  He was scheduled for a CPAP titration/evaluation for BiPAP study however he had returned his machine and canceled this appointment.  He does also report increased reflux symptoms over the past several weeks.  He denies nausea.  He has not had any recent cold symptoms. ? ?ROS:  A comprehensive ROS was completed and negative except as noted per HPI ? ?No Known Allergies ? ?Past Medical History:  ?Diagnosis Date  ? Chlamydia   ? Hypertension   ? Morbid obesity (HCC)   ? Prediabetes   ? ? ?Past Surgical History:  ?Procedure Laterality Date  ? HERNIA REPAIR    ? ? ?Social History  ? ?Socioeconomic History  ? Marital status: Married  ?  Spouse name: Marylene Land  ? Number of children: Not on file  ? Years of education: Not on file  ? Highest education level: Not on file  ?Occupational History  ? Occupation: Coordinator  ?  Comment: [employer]  ?Tobacco Use  ? Smoking status: Former  ?  Packs/day: 0.50  ?  Years: 15.00  ?  Pack years: 7.50  ?  Types: Cigarettes  ?  Quit date: 10/13/2009  ?  Years since quitting: 11.5  ? Smokeless tobacco: Never  ?Vaping Use  ? Vaping Use: Every day  ?Substance and Sexual Activity  ? Alcohol use: Yes  ?  Comment: Rarely  ? Drug use: Never  ? Sexual activity: Not Currently  ?  Partners: Female  ?  Birth control/protection: Abstinence  ?Other Topics Concern  ? Not on file  ?Social History Narrative  ? Not on file  ? ?Social Determinants of Health  ? ?Financial Resource Strain: Not on file   ?Food Insecurity: Not on file  ?Transportation Needs: Not on file  ?Physical Activity: Not on file  ?Stress: Not on file  ?Social Connections: Not on file  ? ? ?Family History  ?Problem Relation Age of Onset  ? Diabetes Mother   ? Hypertension Mother   ? High Cholesterol Mother   ? Heart disease Mother   ? Obesity Mother   ? Heart attack Father   ? High blood pressure Father   ? High Cholesterol Father   ? Heart disease Father   ? Breast cancer Maternal Aunt   ? ? ?Health Maintenance  ?Topic Date Due  ? COVID-19 Vaccine (1) Never done  ? HIV Screening  Never done  ? Hepatitis C Screening  Never done  ? TETANUS/TDAP  Never done  ? COLONOSCOPY (Pts 45-40yrs Insurance coverage will need to be confirmed)  Never done  ? INFLUENZA VACCINE  Never done  ? HPV VACCINES  Aged Out  ? ? ? ?----------------------------------------------------------------------------------------------------------------------------------------------------------------------------------------------------------------- ?Physical Exam ?BP 131/89 (BP Location: Left Wrist, Patient Position: Sitting, Cuff Size: Large)   Pulse 87   Ht 6\' 1"  (1.854 m)   Wt (!) 528 lb (239.5 kg)   SpO2 100%   BMI 69.66 kg/m?  ? ?Physical  Exam ?Constitutional:   ?   Appearance: He is obese.  ?Eyes:  ?   General: No scleral icterus. ?Cardiovascular:  ?   Rate and Rhythm: Normal rate and regular rhythm.  ?Pulmonary:  ?   Effort: Pulmonary effort is normal.  ?   Breath sounds: Normal breath sounds.  ?Musculoskeletal:  ?   Cervical back: Neck supple.  ?Neurological:  ?   Mental Status: He is alert.  ?Psychiatric:     ?   Mood and Affect: Mood normal.     ?   Behavior: Behavior normal.  ? ? ?------------------------------------------------------------------------------------------------------------------------------------------------------------------------------------------------------------------- ?Assessment and Plan ? ?Essential hypertension ?Blood pressure stable at  this time. ? ?Sleep choking syndrome ?History of sleep apnea, unable to tolerate CPAP due to feeling like he was not getting enough air.  May need BiPAP.  We discussed consequences of untreated sleep apnea including heart disease and pulmonary hypertension.  Unclear if this may be contributing to his cough.  Recommend that he follow-up with neurology to discuss titration study. ? ?Productive cough ?Possible related to reflux and/or obesity hypoventilation syndrome.  Adding omeprazole.  Chest x-ray ordered. ? ? ?Meds ordered this encounter  ?Medications  ? tirzepatide [facility]) 10 MG/0.5ML Pen  ?  Sig: Inject 10 mg into the skin once a week.  ?  Dispense:  6 mL  ?  Refill:  0  ? pantoprazole (PROTONIX) 40 MG tablet  ?  Sig: Take 1 tablet (40 mg total) by mouth daily.  ?  Dispense:  30 tablet  ?  Refill:  3  ? losartan (COZAAR) 50 MG tablet  ?  Sig: Take 1 tablet (50 mg total) by mouth daily.  ?  Dispense:  90 tablet  ?  Refill:  3  ? ? ?No follow-ups on file. ? ? ? ?This visit occurred during the SARS-CoV-2 public health emergency.  Safety protocols were in place, including screening questions prior to the visit, additional usage of staff PPE, and extensive cleaning of exam room while observing appropriate contact time as indicated for disinfecting solutions.  ? ?

## 2021-04-20 ENCOUNTER — Encounter: Payer: Managed Care, Other (non HMO) | Admitting: Family Medicine

## 2021-05-23 ENCOUNTER — Encounter: Payer: Self-pay | Admitting: Family Medicine

## 2021-06-08 ENCOUNTER — Ambulatory Visit: Payer: Managed Care, Other (non HMO) | Admitting: Family Medicine

## 2021-06-09 ENCOUNTER — Ambulatory Visit: Payer: Managed Care, Other (non HMO) | Admitting: Family Medicine

## 2021-06-16 ENCOUNTER — Encounter: Payer: Managed Care, Other (non HMO) | Admitting: Family Medicine

## 2021-06-28 ENCOUNTER — Encounter: Payer: Self-pay | Admitting: Family Medicine

## 2021-07-11 IMAGING — DX DG KNEE COMPLETE 4+V*L*
4 series · 4 of 4 positions shown · non-contrast
Comparison: None.

CLINICAL DATA: Acute anterior left knee pain.  Pain for 1 week.

EXAM:
LEFT KNEE - COMPLETE 4+ VIEW

[knee ap]
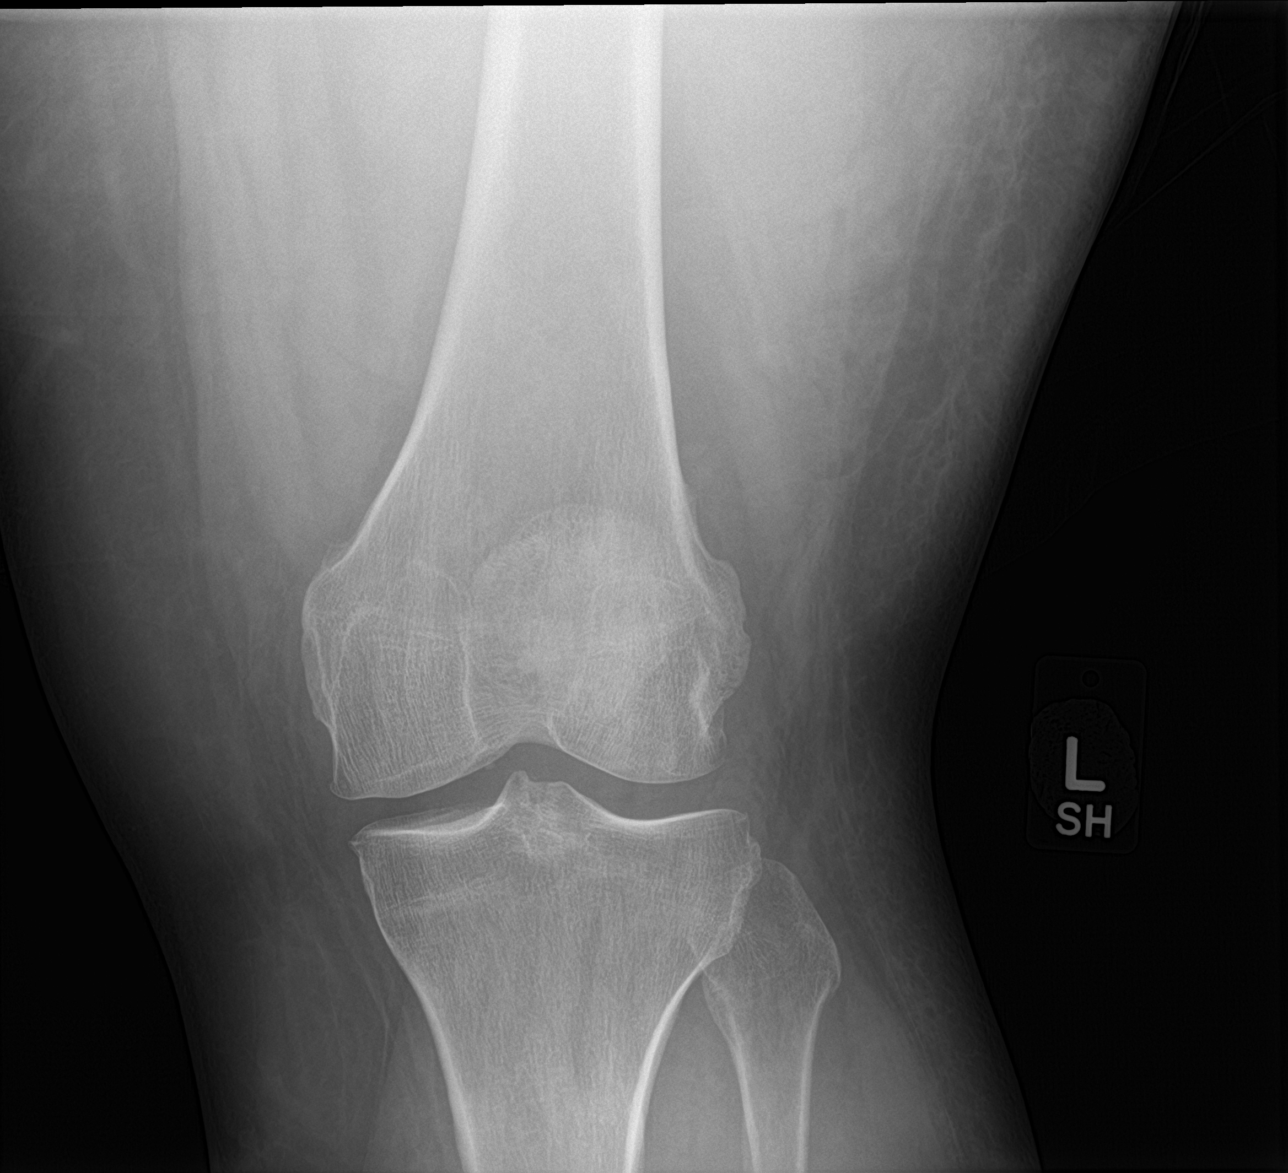

[knee lat]
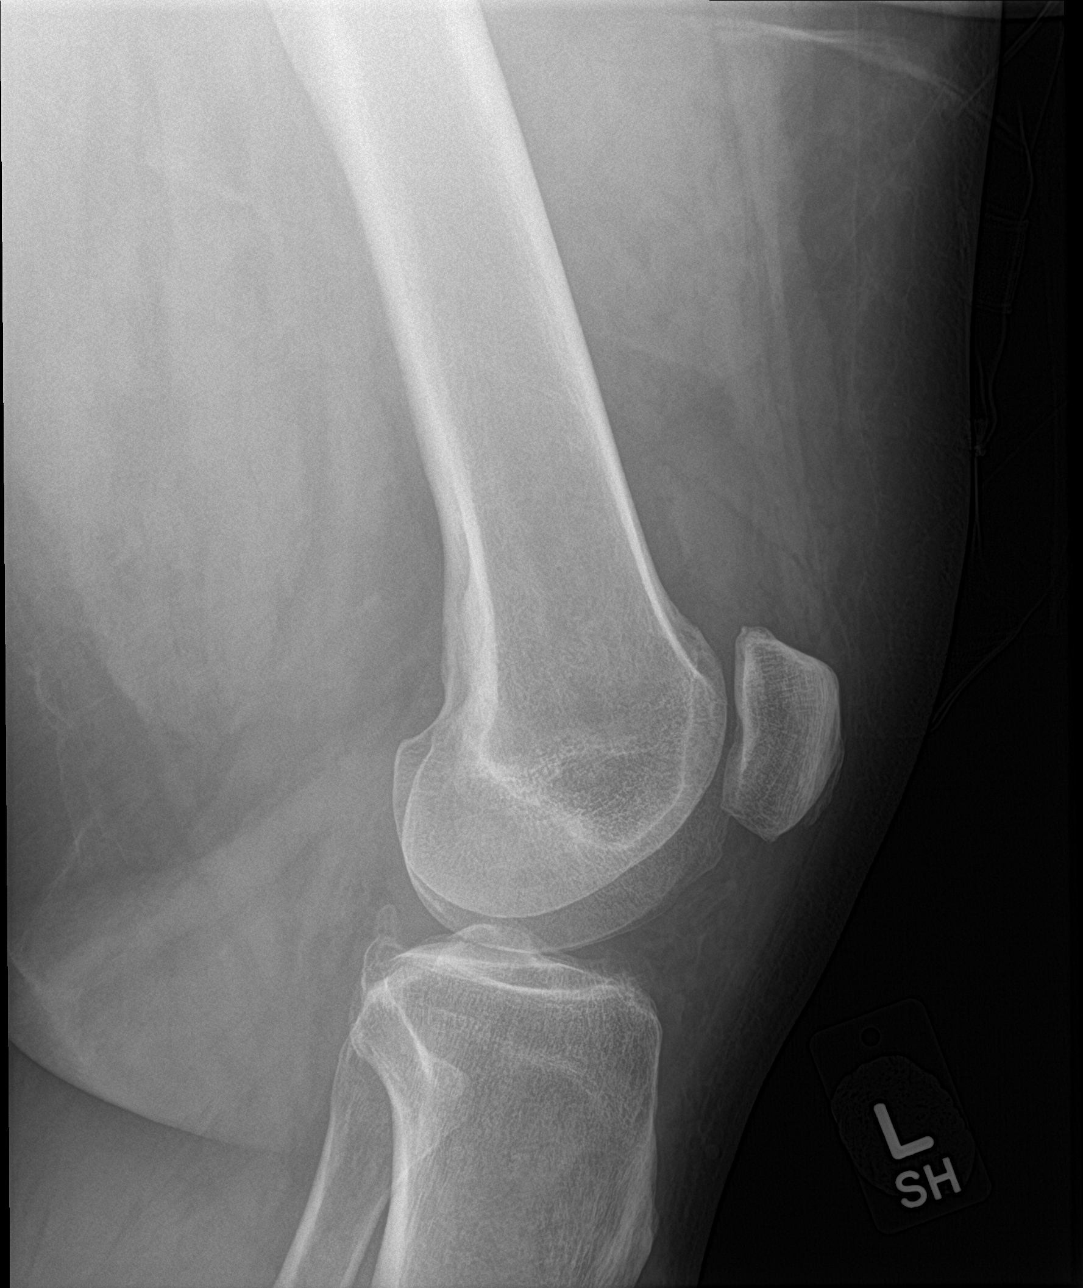

[knee obl (1 of 2)]
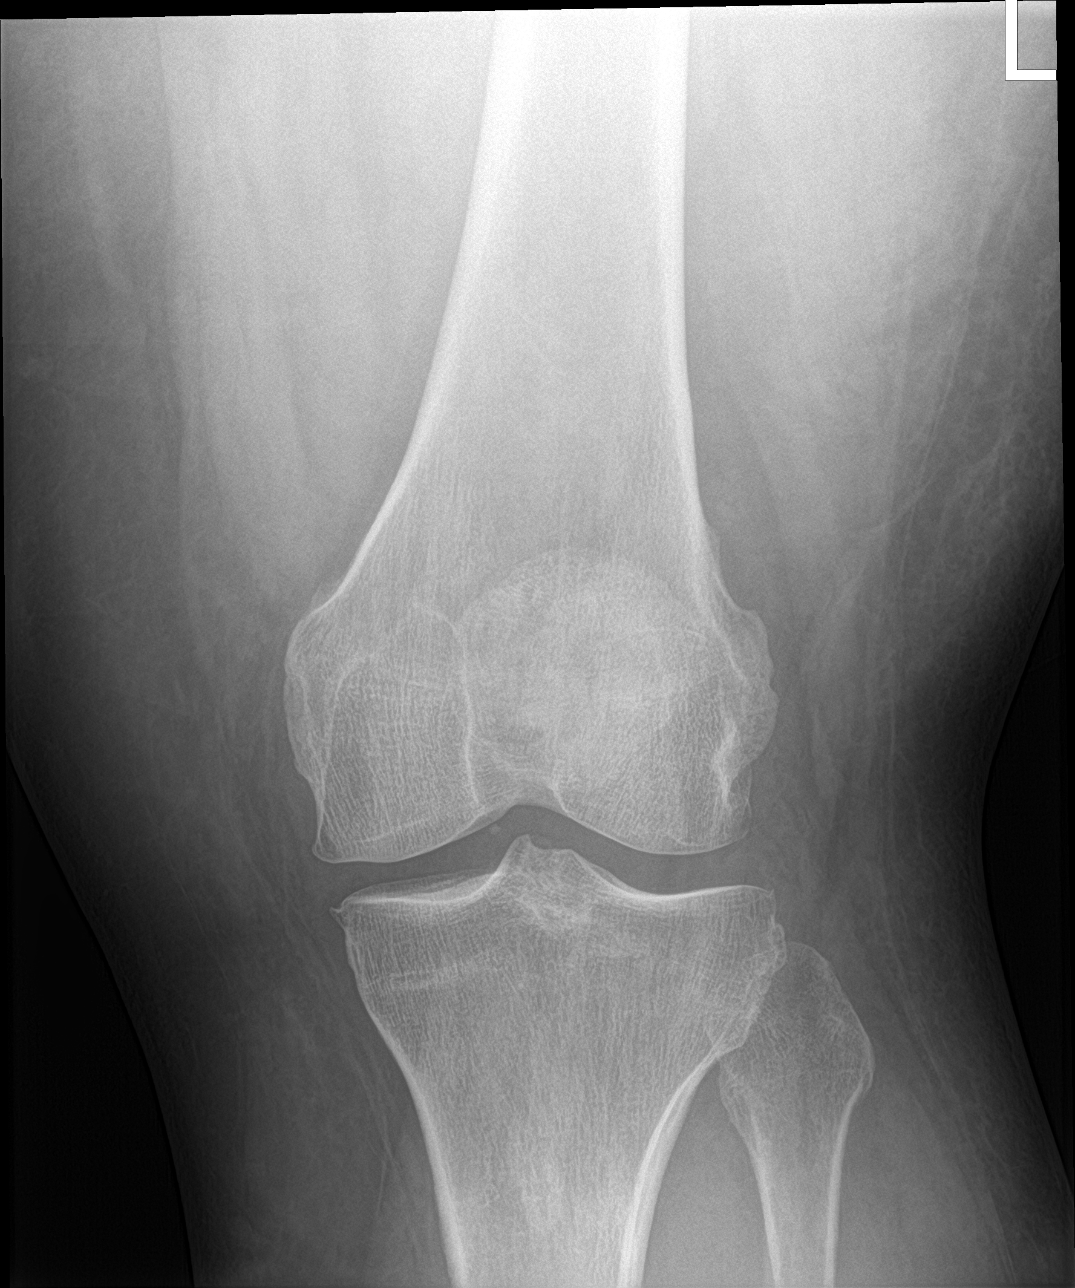

[knee obl (2 of 2)]
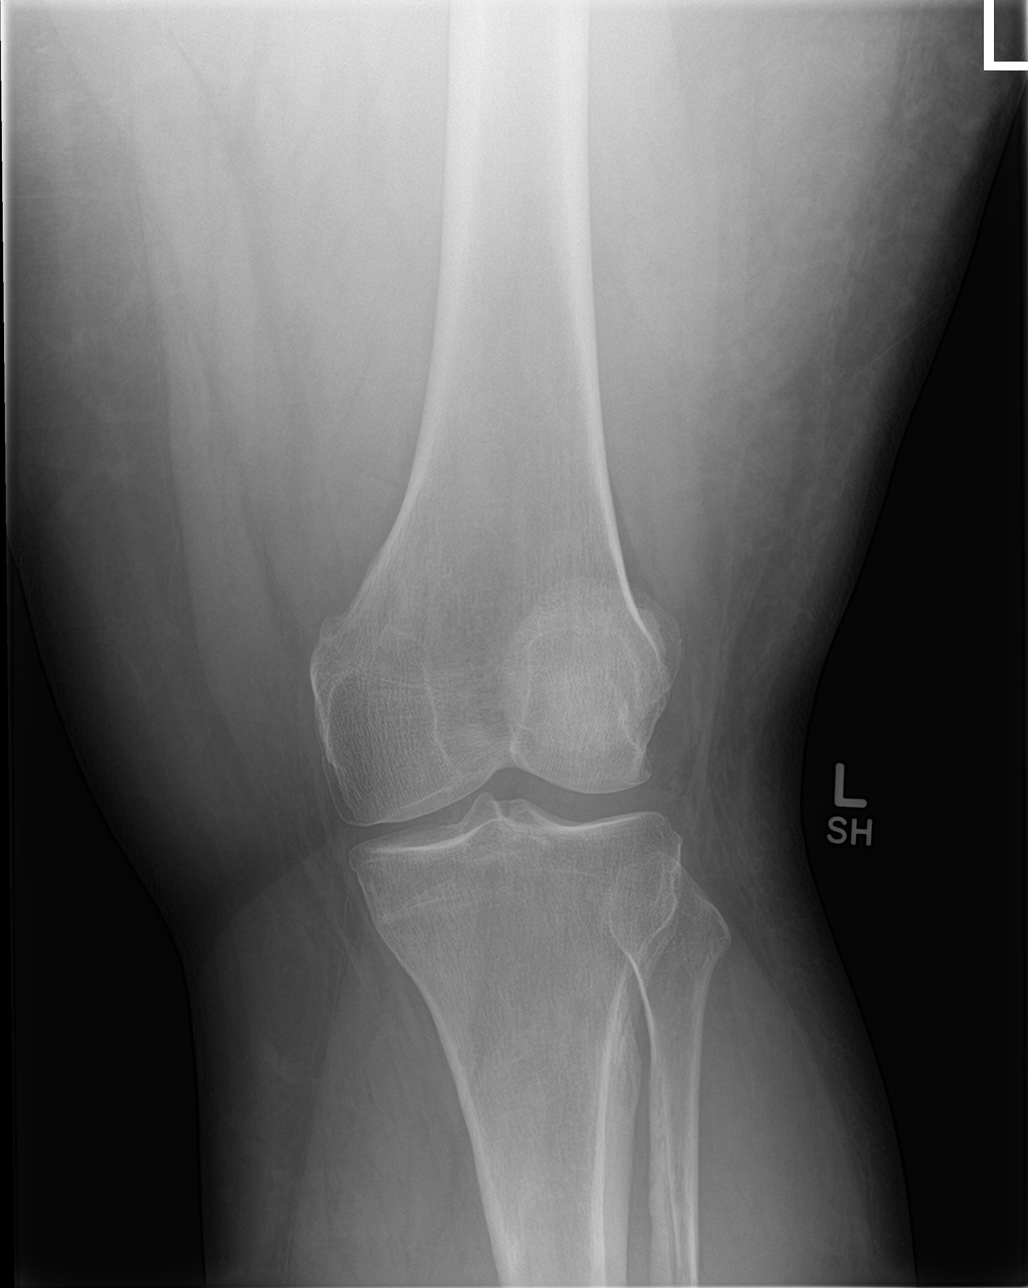

[4 of 4 positions shown; findings below may reference images not displayed]

FINDINGS: Normal alignment and joint spaces. There is mild tricompartmental
peripheral spurring. Small knee joint effusion. No erosion, focal
bone lesion or bone destruction.
IMPRESSION: Mild tricompartmental osteoarthritis with small knee joint effusion.

## 2021-07-28 NOTE — Telephone Encounter (Signed)
Spoke with patient.

## 2021-08-01 ENCOUNTER — Telehealth: Payer: Self-pay | Admitting: General Practice

## 2021-08-01 NOTE — Telephone Encounter (Signed)
Transition Care Management Follow-up Telephone Call Date of discharge and from where: 07/28/21 from [facility] How have you been since you were released from the hospital? Patient's wife stated that he is having an endoscopy right now. Any questions or concerns? No  Items Reviewed: Did the pt receive and understand the discharge instructions provided? Yes  Medications obtained and verified? Yes  Other? No  Any new allergies since your discharge? No  Dietary orders reviewed? Yes Do you have support at home? Yes   Home Care and Equipment/Supplies: Were home health services ordered? no  Functional Questionnaire: (I = Independent and D = Dependent) ADLs: I  Bathing/Dressing- I  Meal Prep- I  Eating- I  Maintaining continence- I  Transferring/Ambulation- I  Managing Meds- I  Follow up appointments reviewed:  PCP Hospital f/u appt confirmed? No  patient's wife stated that they will call back to schedule.  Specialist Hospital f/u appt confirmed? Yes  Patient has already seen a gastroenterologist. Are transportation arrangements needed? No  If their condition worsens, is the pt aware to call PCP or go to the Emergency Dept.? Yes Was the patient provided with contact information for the PCP's office or ED? Yes Was to pt encouraged to call back with questions or concerns? Yes

## 2021-08-15 ENCOUNTER — Telehealth: Payer: Self-pay | Admitting: General Practice

## 2021-08-15 NOTE — Telephone Encounter (Signed)
Transition Care Management Follow-up Telephone Call Date of discharge and from where: 08/13/21 from [facility] How have you been since you were released from the hospital? Doing ok. He has an appt with the GI doctor today. Any questions or concerns? No  Items Reviewed: Did the pt receive and understand the discharge instructions provided? Yes  Medications obtained and verified? No  Other? No  Any new allergies since your discharge? No  Dietary orders reviewed? Yes Do you have support at home? Yes   Home Care and Equipment/Supplies: Were home health services ordered? no  Functional Questionnaire: (I = Independent and D = Dependent) ADLs: I  Bathing/Dressing- I  Meal Prep- I  Eating- I  Maintaining continence- I  Transferring/Ambulation- I  Managing Meds- I  Follow up appointments reviewed:  PCP Hospital f/u appt confirmed? No   Specialist Hospital f/u appt confirmed? Yes  Scheduled to see the GI doctor on 08/15/21 . Are transportation arrangements needed? No  If their condition worsens, is the pt aware to call PCP or go to the Emergency Dept.? Yes Was the patient provided with contact information for the PCP's office or ED? Yes Was to pt encouraged to call back with questions or concerns? Yes

## 2021-09-19 ENCOUNTER — Telehealth: Payer: Self-pay | Admitting: General Practice

## 2021-09-19 NOTE — Telephone Encounter (Signed)
Transition Care Management Unsuccessful Follow-up Telephone Call  Date of discharge and from where:  09/18/21 from [facility]  Attempts:  1st Attempt  Reason for unsuccessful TCM follow-up call:  Voice mail full

## 2021-09-20 NOTE — Telephone Encounter (Signed)
Transition Care Management Unsuccessful Follow-up Telephone Call  Date of discharge and from where:  09/18/21 from [facility]  Attempts:  2nd Attempt  Reason for unsuccessful TCM follow-up call:  Voice mail full

## 2021-09-21 ENCOUNTER — Encounter (INDEPENDENT_AMBULATORY_CARE_PROVIDER_SITE_OTHER): Payer: Self-pay

## 2021-09-26 NOTE — Telephone Encounter (Signed)
Transition Care Management Unsuccessful Follow-up Telephone Call  Date of discharge and from where:  09/18/21 from [facility]  Attempts:  3rd Attempt  Reason for unsuccessful TCM follow-up call:  Voice mail full

## 2021-10-12 ENCOUNTER — Telehealth: Payer: Self-pay | Admitting: General Practice

## 2021-10-12 NOTE — Telephone Encounter (Signed)
Transition Care Management Follow-up Telephone Call Date of discharge and from where: 10/11/21 from [facility] How have you been since you were released from the hospital? Doing ok. Has a kidney stone. Has a referral for urologist. Any questions or concerns? No  Items Reviewed: Did the pt receive and understand the discharge instructions provided? Yes  Medications obtained and verified? No  Other? No  Any new allergies since your discharge? No  Dietary orders reviewed? Yes Do you have support at home? Yes   Home Care and Equipment/Supplies: Were home health services ordered? no  Functional Questionnaire: (I = Independent and D = Dependent) ADLs: I  Bathing/Dressing- I  Meal Prep- I  Eating- I  Maintaining continence- I  Transferring/Ambulation- I  Managing Meds- I  Follow up appointments reviewed:  PCP Hospital f/u appt confirmed? No   Specialist Hospital f/u appt confirmed?  They will call the urologist office and get a referral.   Are transportation arrangements needed? No  If their condition worsens, is the pt aware to call PCP or go to the Emergency Dept.? Yes Was the patient provided with contact information for the PCP's office or ED? Yes Was to pt encouraged to call back with questions or concerns? Yes

## 2021-11-04 ENCOUNTER — Telehealth: Payer: Self-pay | Admitting: General Practice

## 2021-11-04 NOTE — Telephone Encounter (Signed)
Transition Care Management Follow-up Telephone Call Date of discharge and from where: 11/02/21 from [facility] How have you been since you were released from the hospital? Doing better. He will see the oncologist on 11/14/21. Any questions or concerns? No  Items Reviewed: Did the pt receive and understand the discharge instructions provided? Yes  Medications obtained and verified? No  Other? No  Any new allergies since your discharge? No  Dietary orders reviewed? Yes Do you have support at home? Yes   Home Care and Equipment/Supplies: Were home health services ordered? no  Functional Questionnaire: (I = Independent and D = Dependent) ADLs: I  Bathing/Dressing- I  Meal Prep- I  Eating- I  Maintaining continence- I  Transferring/Ambulation- I  Managing Meds- I  Follow up appointments reviewed:  PCP Hospital f/u appt confirmed? No   Specialist Hospital f/u appt confirmed? Yes  Scheduled to see oncologist on 11/14/21. Are transportation arrangements needed? No  If their condition worsens, is the pt aware to call PCP or go to the Emergency Dept.? Yes Was the patient provided with contact information for the PCP's office or ED? Yes Was to pt encouraged to call back with questions or concerns? Yes

## 2022-01-09 ENCOUNTER — Telehealth: Payer: Self-pay | Admitting: General Practice

## 2022-01-09 NOTE — Telephone Encounter (Signed)
Transition Care Management Unsuccessful Follow-up Telephone Call  Date of discharge and from where:  01/08/22 from [facility]  Attempts:  1st Attempt  Reason for unsuccessful TCM follow-up call:  Left voice message

## 2022-01-11 ENCOUNTER — Telehealth: Payer: Self-pay | Admitting: General Practice

## 2022-01-11 NOTE — Telephone Encounter (Signed)
Transition Care Management Unsuccessful Follow-up Telephone Call  Date of discharge and from where:  01/08/22 from [facility]  Attempts:  2nd Attempt  Reason for unsuccessful TCM follow-up call:  Left voice message

## 2022-01-11 NOTE — Telephone Encounter (Signed)
Transition Care Management Unsuccessful Follow-up Telephone Call  Date of discharge and from where:  01/09/22 from [facility]  Attempts:  1st Attempt  Reason for unsuccessful TCM follow-up call:  No answer/busy

## 2022-01-16 NOTE — Telephone Encounter (Signed)
Transition Care Management Unsuccessful Follow-up Telephone Call  Date of discharge and from where:  01/09/22 from [facility]  Attempts:  2nd Attempt  Reason for unsuccessful TCM follow-up call:  No answer/busy

## 2022-01-16 NOTE — Telephone Encounter (Signed)
Transition Care Management Unsuccessful Follow-up Telephone Call  Date of discharge and from where:  01/08/22 from [facility]  Attempts:  3rd Attempt  Reason for unsuccessful TCM follow-up call:  Left voice message

## 2022-01-20 ENCOUNTER — Telehealth (INDEPENDENT_AMBULATORY_CARE_PROVIDER_SITE_OTHER): Payer: Managed Care, Other (non HMO) | Admitting: Family Medicine

## 2022-01-20 ENCOUNTER — Ambulatory Visit (INDEPENDENT_AMBULATORY_CARE_PROVIDER_SITE_OTHER): Payer: Managed Care, Other (non HMO)

## 2022-01-20 ENCOUNTER — Encounter: Payer: Self-pay | Admitting: Family Medicine

## 2022-01-20 VITALS — HR 82 | Temp 98.4°F

## 2022-01-20 DIAGNOSIS — U071 COVID-19: Secondary | ICD-10-CM | POA: Diagnosis not present

## 2022-01-20 DIAGNOSIS — J1282 Pneumonia due to coronavirus disease 2019: Secondary | ICD-10-CM | POA: Diagnosis not present

## 2022-01-20 DIAGNOSIS — Z8616 Personal history of COVID-19: Secondary | ICD-10-CM | POA: Diagnosis not present

## 2022-01-20 DIAGNOSIS — R051 Acute cough: Secondary | ICD-10-CM | POA: Diagnosis not present

## 2022-01-20 MED ORDER — ALBUTEROL SULFATE HFA 108 (90 BASE) MCG/ACT IN AERS: 2.0000 | INHALATION_SPRAY | Freq: Four times a day (QID) | RESPIRATORY_TRACT | 0 refills | Status: AC | PRN

## 2022-01-20 MED ORDER — CEFDINIR 300 MG PO CAPS: 300.0000 mg | ORAL_CAPSULE | Freq: Two times a day (BID) | ORAL | 0 refills | Status: AC

## 2022-01-20 MED ORDER — DOXYCYCLINE HYCLATE 100 MG PO TABS: 100.0000 mg | ORAL_TABLET | Freq: Two times a day (BID) | ORAL | 0 refills | Status: AC

## 2022-01-22 ENCOUNTER — Encounter: Payer: Self-pay | Admitting: Family Medicine

## 2022-01-22 DIAGNOSIS — R059 Cough, unspecified: Secondary | ICD-10-CM | POA: Insufficient documentation

## 2022-01-22 NOTE — Progress Notes (Signed)
Ralph Small - 46 y.o. male MRN [id number]  Date of birth: [birth year]   This visit type was conducted due to national recommendations for restrictions regarding the COVID-19 Pandemic (e.g. social distancing).  This format is felt to be most appropriate for this patient at this time.  All issues noted in this document were discussed and addressed.  No physical exam was performed (except for noted visual exam findings with Video Visits).  I discussed the limitations of evaluation and management by telemedicine and the availability of in person appointments. The patient expressed understanding and agreed to proceed.  I connected withNAME@ on 01/22/22 at  1:00 PM EST by a video enabled telemedicine application and verified that I am speaking with the correct person using two identifiers.  Present at visit: Everrett Coombe, DO William Hamburger Rahimi   Patient Location: Home [address] [place] [address]   Provider location:   [facility]  No chief complaint on file.   HPI  Ralph Small is a 46 y.o. male who presents via audio/video conferencing for a telehealth visit today.  Rilee was diagnosed with COVID on 01/09/2022.  He was treated with Paxlovid and did pretty well with this however over the past several days he has had increased cough and chest congestion.  Mild dyspnea on with exertion.  Denies significant shortness of breath.  He has undergoing chemotherapy for treatment of esophageal cancer.  He has not had sinus pain or pressure.  Denies GI symptoms.  He has been afebrile.  Denies chills.  Sputum has been thickened and yellow appearing.   ROS:  A comprehensive ROS was completed and negative except as noted per HPI  Past Medical History:  Diagnosis Date   Chlamydia    Hypertension    Morbid obesity (HCC)    Prediabetes     Past Surgical History:  Procedure Laterality Date   HERNIA REPAIR      Family History  Problem Relation Age of Onset   Diabetes Mother     Hypertension Mother    High Cholesterol Mother    Heart disease Mother    Obesity Mother    Heart attack Father    High blood pressure Father    High Cholesterol Father    Heart disease Father    Breast cancer Maternal Aunt     Social History   Socioeconomic History   Marital status: Married    Spouse name: Marylene Land   Number of children: Not on file   Years of education: Not on file   Highest education level: Not on file  Occupational History   Occupation: Coordinator    Comment: [employer]  Tobacco Use   Smoking status: Former    Packs/day: 0.50    Years: 15.00    Total pack years: 7.50    Types: Cigarettes    Quit date: 10/13/2009    Years since quitting: 12.2   Smokeless tobacco: Never  Vaping Use   Vaping Use: Every day  Substance and Sexual Activity   Alcohol use: Yes    Comment: Rarely   Drug use: Never   Sexual activity: Not Currently    Partners: Female    Birth control/protection: Abstinence  Other Topics Concern   Not on file  Social History Narrative   Not on file   Social Determinants of Health   Financial Resource Strain: Not on file  Food Insecurity: Not on file  Transportation Needs: Not on file  Physical Activity: Not  on file  Stress: Not on file  Social Connections: Not on file  Intimate Partner Violence: Not on file     Current Outpatient Medications:    cefdinir (OMNICEF) 300 MG capsule, Take 1 capsule (300 mg total) by mouth 2 (two) times daily., Disp: 20 capsule, Rfl: 0   doxycycline (VIBRA-TABS) 100 MG tablet, Take 1 tablet (100 mg total) by mouth 2 (two) times daily., Disp: 20 tablet, Rfl: 0   albuterol (VENTOLIN HFA) 108 (90 Base) MCG/ACT inhaler, Inhale 2 puffs into the lungs every 6 (six) hours as needed for wheezing or shortness of breath., Disp: 8 g, Rfl: 0   atorvastatin (LIPITOR) 20 MG tablet, Take 1 tablet (20 mg total) by mouth daily., Disp: 90 tablet, Rfl: 3   losartan (COZAAR) 50 MG tablet, Take 1 tablet (50 mg total)  by mouth daily., Disp: 90 tablet, Rfl: 3   meloxicam (MOBIC) 15 MG tablet, Take 1 tablet (15 mg total) by mouth daily., Disp: 30 tablet, Rfl: 0   metFORMIN (GLUCOPHAGE-XR) 500 MG 24 hr tablet, TAKE 1 TABLET(500 MG) BY MOUTH DAILY WITH BREAKFAST, Disp: 30 tablet, Rfl: 0   pantoprazole (PROTONIX) 40 MG tablet, Take 1 tablet (40 mg total) by mouth daily., Disp: 30 tablet, Rfl: 3   tirzepatide (MOUNJARO) 10 MG/0.5ML Pen, Inject 10 mg into the skin once a week., Disp: 6 mL, Rfl: 0   tirzepatide (MOUNJARO) 7.5 MG/0.5ML Pen, Inject 7.5 mg into the skin once a week., Disp: 6 mL, Rfl: 3  EXAM:  VITALS per patient if applicable: Pulse 82   Temp 98.4 F (36.9 C)   SpO2 95%   GENERAL: alert, oriented, appears well and in no acute distress  HEENT: atraumatic, conjunttiva clear, no obvious abnormalities on inspection of external nose and ears  NECK: normal movements of the head and neck  LUNGS: on inspection no signs of respiratory distress, breathing rate appears normal, no obvious gross SOB, gasping or wheezing  CV: no obvious cyanosis  MS: moves all visible extremities without noticeable abnormality  PSYCH/NEURO: pleasant and cooperative, no obvious depression or anxiety, speech and thought processing grossly intact  ASSESSMENT AND PLAN:  Discussed the following assessment and plan:  Cough Has had increased productive cough over the past few days.  He may continue over-the-counter treatment as needed for symptom control.  Adding course of doxycycline and chest x-ray ordered.  He is at increased risk due to his treatment for esophageal cancer.  Update: Chest x-ray suspicious for pneumonia.  Adding cefdinir in addition to doxycycline.  Instructed to contact clinic if symptoms or not improving or if having worsening symptoms.     I discussed the assessment and treatment plan with the patient. The patient was provided an opportunity to ask questions and all were answered. The patient agreed  with the plan and demonstrated an understanding of the instructions.   The patient was advised to call back or seek an in-person evaluation if the symptoms worsen or if the condition fails to improve as anticipated.    Luetta Nutting, DO

## 2022-01-22 NOTE — Assessment & Plan Note (Signed)
Has had increased productive cough over the past few days.  He may continue over-the-counter treatment as needed for symptom control.  Adding course of doxycycline and chest x-ray ordered.  He is at increased risk due to his treatment for esophageal cancer.  Update: Chest x-ray suspicious for pneumonia.  Adding cefdinir in addition to doxycycline.  Instructed to contact clinic if symptoms or not improving or if having worsening symptoms.

## 2022-03-13 ENCOUNTER — Telehealth: Payer: Self-pay | Admitting: General Practice

## 2022-03-13 NOTE — Telephone Encounter (Signed)
Transition Care Management Unsuccessful Follow-up Telephone Call  Date of discharge and from where:  03/12/22 from [facility]  Attempts:  1st Attempt  Reason for unsuccessful TCM follow-up call:  Left voice message    

## 2022-03-16 NOTE — Telephone Encounter (Signed)
Transition Care Management Unsuccessful Follow-up Telephone Call  Date of discharge and from where:  03/12/22 from [facility]  Attempts:  2nd Attempt  Reason for unsuccessful TCM follow-up call:  Left voice message    

## 2022-03-17 NOTE — Telephone Encounter (Signed)
Transition Care Management Unsuccessful Follow-up Telephone Call  Date of discharge and from where:  03/12/22 from [facility]  Attempts:  3rd Attempt  Reason for unsuccessful TCM follow-up call:  Left voice message    

## 2022-04-10 ENCOUNTER — Telehealth: Payer: Self-pay

## 2022-04-10 NOTE — Transitions of Care (Post Inpatient/ED Visit) (Unsigned)
   04/10/2022  Name: Ralph Small MRN: [id number] DOB: May 30, 1975  Today's TOC FU Call Status: Today's TOC FU Call Status:: Unsuccessul Call (1st Attempt) Unsuccessful Call (1st Attempt) Date: 04/10/22  Attempted to reach the patient regarding the most recent Inpatient/ED visit.  Follow Up Plan: Additional outreach attempts will be made to reach the patient to complete the Transitions of Care (Post Inpatient/ED visit) call.   Signature Juanda Crumble, [place] Direct Dial [phone]

## 2022-04-12 NOTE — Transitions of Care (Post Inpatient/ED Visit) (Signed)
   04/12/2022  Name: MUSTAF FOUTCH MRN: [id number] DOB: 1975/06/29  Today's TOC FU Call Status: Today's TOC FU Call Status:: Successful TOC FU Call Competed Unsuccessful Call (1st Attempt) Date: 04/10/22 [facility] FU Call Complete Date: 04/12/22  Transition Care Management Follow-up Telephone Call Date of Discharge: 04/08/22 Discharge Facility: Other ([facility]) Name of Other (Non[facility]) Discharge Facility: [facility] Type of Discharge: Inpatient Admission Primary Inpatient Discharge Diagnosis:: GI bleed How have you been since you were released from the hospital?: Same Any questions or concerns?: Yes Patient Questions/Concerns:: weak  Items Reviewed: Did you receive and understand the discharge instructions provided?: Yes Medications obtained and verified?: Yes (Medications Reviewed) Any new allergies since your discharge?: No Dietary orders reviewed?: Yes Do you have support at home?: Yes People in Home: spouse  Home Care and Equipment/Supplies: [place] Ordered?: NA Any new equipment or medical supplies ordered?: NA  Functional Questionnaire: Do you need assistance with bathing/showering or dressing?: Yes Do you need assistance with meal preparation?: Yes Do you need assistance with eating?: No Do you have difficulty maintaining continence: No Do you need assistance with getting out of bed/getting out of a chair/moving?: Yes Do you have difficulty managing or taking your medications?: No  Folllow up appointments reviewed: PCP Follow-up appointment confirmed?: [facility] Follow-up appointment confirmed?: Yes Date of Specialist follow-up appointment?: 04/17/22 Follow-Up Specialty Provider:: Dr Arvin Collard Do you need transportation to your follow-up appointment?: No Do you understand care options if your condition(s) worsen?: Yes-patient verbalized understanding    [place], [place] Nurse Health Advisor Direct  Dial [phone]

## 2022-05-15 DEATH — deceased

## 2022-05-29 ENCOUNTER — Encounter: Payer: Self-pay | Admitting: *Deleted
